# Patient Record
Sex: Male | Born: 1965 | Race: White | Hispanic: No | Smoking: Never smoker
Health system: Southern US, Community
[De-identification: ages and names within clinical notes are randomized; demographics above are authoritative.]

## PROBLEM LIST (undated history)

## (undated) DIAGNOSIS — K219 Gastro-esophageal reflux disease without esophagitis: Secondary | ICD-10-CM

## (undated) DIAGNOSIS — Z8719 Personal history of other diseases of the digestive system: Secondary | ICD-10-CM

## (undated) DIAGNOSIS — E785 Hyperlipidemia, unspecified: Secondary | ICD-10-CM

## (undated) DIAGNOSIS — H903 Sensorineural hearing loss, bilateral: Secondary | ICD-10-CM

## (undated) DIAGNOSIS — C61 Malignant neoplasm of prostate: Secondary | ICD-10-CM

## (undated) DIAGNOSIS — I10 Essential (primary) hypertension: Secondary | ICD-10-CM

## (undated) DIAGNOSIS — S92911A Unspecified fracture of right toe(s), initial encounter for closed fracture: Secondary | ICD-10-CM

## (undated) HISTORY — PX: COLONOSCOPY: SHX174

## (undated) HISTORY — DX: Gastro-esophageal reflux disease without esophagitis: K21.9

## (undated) HISTORY — DX: Essential (primary) hypertension: I10

## (undated) HISTORY — DX: Sensorineural hearing loss, bilateral: H90.3

## (undated) HISTORY — DX: Personal history of other diseases of the digestive system: Z87.19

## (undated) HISTORY — PX: ESOPHAGOGASTRODUODENOSCOPY: SHX1529

## (undated) HISTORY — DX: Hyperlipidemia, unspecified: E78.5

## (undated) HISTORY — DX: Malignant neoplasm of prostate: C61

## (undated) HISTORY — DX: Unspecified fracture of right toe(s), initial encounter for closed fracture: S92.911A

---

## 1988-04-30 HISTORY — PX: APPENDECTOMY: SHX54

## 1998-07-02 ENCOUNTER — Emergency Department (HOSPITAL_COMMUNITY): Admission: EM | Admit: 1998-07-02 | Discharge: 1998-07-02 | Payer: Self-pay | Admitting: *Deleted

## 1998-07-02 ENCOUNTER — Encounter: Payer: Self-pay | Admitting: Emergency Medicine

## 1998-07-06 ENCOUNTER — Encounter: Admission: RE | Admit: 1998-07-06 | Discharge: 1998-07-06 | Payer: Self-pay | Admitting: Hematology and Oncology

## 2000-07-17 ENCOUNTER — Ambulatory Visit (HOSPITAL_COMMUNITY): Admission: RE | Admit: 2000-07-17 | Discharge: 2000-07-17 | Payer: Self-pay | Admitting: Gastroenterology

## 2003-12-24 ENCOUNTER — Encounter: Admission: RE | Admit: 2003-12-24 | Discharge: 2004-01-21 | Payer: Self-pay | Admitting: Family Medicine

## 2004-04-03 ENCOUNTER — Ambulatory Visit: Payer: Self-pay | Admitting: Family Medicine

## 2004-05-12 ENCOUNTER — Ambulatory Visit: Payer: Self-pay | Admitting: Family Medicine

## 2004-06-07 ENCOUNTER — Ambulatory Visit: Payer: Self-pay | Admitting: Family Medicine

## 2004-06-14 ENCOUNTER — Ambulatory Visit: Payer: Self-pay | Admitting: Family Medicine

## 2004-07-25 ENCOUNTER — Ambulatory Visit: Payer: Self-pay | Admitting: Internal Medicine

## 2004-08-08 ENCOUNTER — Ambulatory Visit: Payer: Self-pay | Admitting: Family Medicine

## 2004-08-11 ENCOUNTER — Encounter: Admission: RE | Admit: 2004-08-11 | Discharge: 2004-08-11 | Payer: Self-pay | Admitting: Family Medicine

## 2004-12-11 ENCOUNTER — Ambulatory Visit: Payer: Self-pay | Admitting: Family Medicine

## 2005-05-15 ENCOUNTER — Ambulatory Visit: Payer: Self-pay | Admitting: Family Medicine

## 2005-07-24 ENCOUNTER — Ambulatory Visit: Payer: Self-pay | Admitting: Internal Medicine

## 2005-08-24 ENCOUNTER — Ambulatory Visit: Payer: Self-pay | Admitting: Family Medicine

## 2005-10-26 ENCOUNTER — Ambulatory Visit: Payer: Self-pay | Admitting: Internal Medicine

## 2005-11-05 ENCOUNTER — Ambulatory Visit: Payer: Self-pay | Admitting: Family Medicine

## 2006-01-15 ENCOUNTER — Ambulatory Visit: Payer: Self-pay | Admitting: Family Medicine

## 2006-04-10 ENCOUNTER — Ambulatory Visit: Payer: Self-pay | Admitting: Family Medicine

## 2006-07-01 ENCOUNTER — Ambulatory Visit: Payer: Self-pay | Admitting: Internal Medicine

## 2006-08-16 ENCOUNTER — Ambulatory Visit: Payer: Self-pay | Admitting: Family Medicine

## 2006-09-04 ENCOUNTER — Ambulatory Visit: Payer: Self-pay | Admitting: Family Medicine

## 2006-09-04 LAB — CONVERTED CEMR LAB
ALT: 31 units/L (ref 0–40)
AST: 26 units/L (ref 0–37)
Albumin: 4.2 g/dL (ref 3.5–5.2)
Alkaline Phosphatase: 52 units/L (ref 39–117)
BUN: 11 mg/dL (ref 6–23)
Basophils Absolute: 0 10*3/uL (ref 0.0–0.1)
Basophils Relative: 0.6 % (ref 0.0–1.0)
Bilirubin, Direct: 0.1 mg/dL (ref 0.0–0.3)
CO2: 32 meq/L (ref 19–32)
Calcium: 9.4 mg/dL (ref 8.4–10.5)
Chloride: 112 meq/L (ref 96–112)
Cholesterol: 215 mg/dL (ref 0–200)
Creatinine, Ser: 0.9 mg/dL (ref 0.4–1.5)
Direct LDL: 151.8 mg/dL
Eosinophils Absolute: 0.5 10*3/uL (ref 0.0–0.6)
Eosinophils Relative: 6.7 % — ABNORMAL HIGH (ref 0.0–5.0)
GFR calc Af Amer: 119 mL/min
GFR calc non Af Amer: 98 mL/min
Glucose, Bld: 93 mg/dL (ref 70–99)
HCT: 45.6 % (ref 39.0–52.0)
HDL: 42.9 mg/dL (ref >39.0)
Hemoglobin: 15.8 g/dL (ref 13.0–17.0)
Lymphocytes Relative: 30.1 % (ref 12.0–46.0)
MCHC: 34.6 g/dL (ref 30.0–36.0)
MCV: 90 fL (ref 78.0–100.0)
Monocytes Absolute: 0.7 10*3/uL (ref 0.2–0.7)
Monocytes Relative: 10.4 % (ref 3.0–11.0)
Neutro Abs: 3.6 10*3/uL (ref 1.4–7.7)
Neutrophils Relative %: 52.2 % (ref 43.0–77.0)
Platelets: 283 10*3/uL (ref 150–400)
Potassium: 4.6 meq/L (ref 3.5–5.1)
RBC: 5.07 M/uL (ref 4.22–5.81)
RDW: 12.1 % (ref 11.5–14.6)
Sodium: 147 meq/L — ABNORMAL HIGH (ref 135–145)
TSH: 1.74 microintl units/mL (ref 0.35–5.50)
Total Bilirubin: 1.1 mg/dL (ref 0.3–1.2)
Total CHOL/HDL Ratio: 5
Total Protein: 6.9 g/dL (ref 6.0–8.3)
Triglycerides: 73 mg/dL (ref 0–149)
VLDL: 15 mg/dL (ref 0–40)
WBC: 6.9 10*3/uL (ref 4.5–10.5)

## 2006-09-11 ENCOUNTER — Ambulatory Visit: Payer: Self-pay | Admitting: Family Medicine

## 2006-10-23 ENCOUNTER — Ambulatory Visit: Payer: Self-pay | Admitting: Internal Medicine

## 2007-02-21 ENCOUNTER — Ambulatory Visit: Payer: Self-pay | Admitting: Gastroenterology

## 2007-02-27 ENCOUNTER — Ambulatory Visit: Payer: Self-pay | Admitting: Family Medicine

## 2007-02-27 LAB — CONVERTED CEMR LAB
Glucose, Urine, Semiquant: NEGATIVE
Ketones, urine, test strip: NEGATIVE
Nitrite: NEGATIVE
Protein, U semiquant: NEGATIVE
Specific Gravity, Urine: 1.015
Urobilinogen, UA: NEGATIVE
WBC Urine, dipstick: NEGATIVE
pH: 7.5

## 2007-05-01 HISTORY — PX: WRIST SURGERY: SHX841

## 2007-06-17 ENCOUNTER — Telehealth: Payer: Self-pay | Admitting: Family Medicine

## 2007-06-17 ENCOUNTER — Ambulatory Visit: Payer: Self-pay | Admitting: Family Medicine

## 2007-07-03 ENCOUNTER — Ambulatory Visit (HOSPITAL_COMMUNITY): Admission: RE | Admit: 2007-07-03 | Discharge: 2007-07-05 | Payer: Self-pay | Admitting: Orthopedic Surgery

## 2007-07-22 ENCOUNTER — Ambulatory Visit: Payer: Self-pay | Admitting: Internal Medicine

## 2007-09-03 ENCOUNTER — Ambulatory Visit: Payer: Self-pay | Admitting: Family Medicine

## 2007-09-03 LAB — CONVERTED CEMR LAB
ALT: 33 units/L (ref 0–53)
AST: 25 units/L (ref 0–37)
Albumin: 4.2 g/dL (ref 3.5–5.2)
Alkaline Phosphatase: 57 units/L (ref 39–117)
BUN: 12 mg/dL (ref 6–23)
Basophils Absolute: 0 10*3/uL (ref 0.0–0.1)
Basophils Relative: 0.4 % (ref 0.0–1.0)
Bilirubin, Direct: 0.1 mg/dL (ref 0.0–0.3)
Blood in Urine, dipstick: NEGATIVE
CO2: 29 meq/L (ref 19–32)
Calcium: 9.4 mg/dL (ref 8.4–10.5)
Chloride: 111 meq/L (ref 96–112)
Cholesterol: 223 mg/dL (ref 0–200)
Creatinine, Ser: 0.9 mg/dL (ref 0.4–1.5)
Direct LDL: 174.3 mg/dL
Eosinophils Absolute: 0.1 10*3/uL (ref 0.0–0.7)
Eosinophils Relative: 2.3 % (ref 0.0–5.0)
GFR calc Af Amer: 118 mL/min
GFR calc non Af Amer: 98 mL/min
Glucose, Bld: 103 mg/dL — ABNORMAL HIGH (ref 70–99)
Glucose, Urine, Semiquant: NEGATIVE
HCT: 43.3 % (ref 39.0–52.0)
HDL: 38.6 mg/dL — ABNORMAL LOW (ref >39.0)
Hemoglobin: 15.2 g/dL (ref 13.0–17.0)
Lymphocytes Relative: 27.4 % (ref 12.0–46.0)
MCHC: 35.1 g/dL (ref 30.0–36.0)
MCV: 89.7 fL (ref 78.0–100.0)
Monocytes Absolute: 0.7 10*3/uL (ref 0.1–1.0)
Monocytes Relative: 10.5 % (ref 3.0–12.0)
Neutro Abs: 3.7 10*3/uL (ref 1.4–7.7)
Neutrophils Relative %: 59.4 % (ref 43.0–77.0)
Nitrite: NEGATIVE
Platelets: 250 10*3/uL (ref 150–400)
Potassium: 4.5 meq/L (ref 3.5–5.1)
RBC: 4.82 M/uL (ref 4.22–5.81)
RDW: 12.4 % (ref 11.5–14.6)
Sodium: 144 meq/L (ref 135–145)
Specific Gravity, Urine: 1.02
TSH: 1.81 microintl units/mL (ref 0.35–5.50)
Total Bilirubin: 1.2 mg/dL (ref 0.3–1.2)
Total CHOL/HDL Ratio: 5.8
Total Protein: 6.9 g/dL (ref 6.0–8.3)
Triglycerides: 44 mg/dL (ref 0–149)
Urobilinogen, UA: 0.2
VLDL: 9 mg/dL (ref 0–40)
WBC Urine, dipstick: NEGATIVE
WBC: 6.2 10*3/uL (ref 4.5–10.5)
pH: 5.5

## 2007-09-08 ENCOUNTER — Ambulatory Visit: Payer: Self-pay | Admitting: Family Medicine

## 2007-09-08 ENCOUNTER — Telehealth: Payer: Self-pay | Admitting: Family Medicine

## 2007-09-08 DIAGNOSIS — E785 Hyperlipidemia, unspecified: Secondary | ICD-10-CM | POA: Insufficient documentation

## 2007-09-15 ENCOUNTER — Ambulatory Visit: Payer: Self-pay | Admitting: Cardiology

## 2007-10-04 ENCOUNTER — Encounter: Admission: RE | Admit: 2007-10-04 | Discharge: 2007-10-04 | Payer: Self-pay | Admitting: Orthopaedic Surgery

## 2007-10-23 ENCOUNTER — Ambulatory Visit: Payer: Self-pay | Admitting: Cardiology

## 2007-10-23 LAB — CONVERTED CEMR LAB
Bilirubin, Direct: 0.2 mg/dL (ref 0.0–0.3)
LDL Cholesterol: 107 mg/dL — ABNORMAL HIGH (ref 0–99)
Total Bilirubin: 1.2 mg/dL (ref 0.3–1.2)
VLDL: 15 mg/dL (ref 0–40)

## 2007-10-27 ENCOUNTER — Ambulatory Visit: Payer: Self-pay | Admitting: Cardiology

## 2007-11-24 ENCOUNTER — Ambulatory Visit: Payer: Self-pay | Admitting: Internal Medicine

## 2007-12-02 ENCOUNTER — Ambulatory Visit: Payer: Self-pay | Admitting: Family Medicine

## 2008-01-12 ENCOUNTER — Ambulatory Visit: Payer: Self-pay | Admitting: Family Medicine

## 2008-01-12 LAB — CONVERTED CEMR LAB
Albumin: 4.5 g/dL (ref 3.5–5.2)
HDL: 41.6 mg/dL (ref >39.0)
LDL Cholesterol: 133 mg/dL — ABNORMAL HIGH (ref 0–99)
Total CHOL/HDL Ratio: 4.5
Triglycerides: 71 mg/dL (ref 0–149)
VLDL: 14 mg/dL (ref 0–40)

## 2008-01-19 ENCOUNTER — Ambulatory Visit: Payer: Self-pay | Admitting: Cardiovascular Disease

## 2008-03-12 ENCOUNTER — Ambulatory Visit: Payer: Self-pay | Admitting: Family Medicine

## 2008-03-12 LAB — CONVERTED CEMR LAB
ALT: 37 units/L (ref 0–53)
Bilirubin, Direct: 0.1 mg/dL (ref 0.0–0.3)
Cholesterol: 141 mg/dL (ref 0–200)
HDL: 35.4 mg/dL — ABNORMAL LOW (ref >39.0)
Total Protein: 6.8 g/dL (ref 6.0–8.3)
VLDL: 7 mg/dL (ref 0–40)

## 2008-07-19 ENCOUNTER — Ambulatory Visit: Payer: Self-pay | Admitting: Internal Medicine

## 2008-07-19 DIAGNOSIS — J309 Allergic rhinitis, unspecified: Secondary | ICD-10-CM | POA: Insufficient documentation

## 2008-09-22 ENCOUNTER — Ambulatory Visit: Payer: Self-pay | Admitting: Family Medicine

## 2008-09-22 LAB — CONVERTED CEMR LAB
ALT: 40 units/L (ref 0–53)
AST: 37 units/L (ref 0–37)
Alkaline Phosphatase: 55 units/L (ref 39–117)
Bilirubin Urine: NEGATIVE
Bilirubin, Direct: 0.2 mg/dL (ref 0.0–0.3)
CO2: 28 meq/L (ref 19–32)
Calcium: 8.9 mg/dL (ref 8.4–10.5)
Creatinine, Ser: 0.9 mg/dL (ref 0.4–1.5)
Direct LDL: 177.4 mg/dL
Eosinophils Absolute: 0.1 10*3/uL (ref 0.0–0.7)
Glucose, Bld: 104 mg/dL — ABNORMAL HIGH (ref 70–99)
Glucose, Urine, Semiquant: NEGATIVE
MCHC: 35.2 g/dL (ref 30.0–36.0)
MCV: 91.3 fL (ref 78.0–100.0)
Monocytes Absolute: 0.7 10*3/uL (ref 0.1–1.0)
Neutrophils Relative %: 52.8 % (ref 43.0–77.0)
Platelets: 265 10*3/uL (ref 150.0–400.0)
RDW: 12.1 % (ref 11.5–14.6)
TSH: 1.56 microintl units/mL (ref 0.35–5.50)
Total Bilirubin: 1.5 mg/dL — ABNORMAL HIGH (ref 0.3–1.2)
Triglycerides: 74 mg/dL (ref 0.0–149.0)
Urobilinogen, UA: 0.2

## 2008-10-07 ENCOUNTER — Ambulatory Visit: Payer: Self-pay | Admitting: Family Medicine

## 2008-11-02 ENCOUNTER — Ambulatory Visit: Payer: Self-pay | Admitting: Family Medicine

## 2009-10-03 ENCOUNTER — Ambulatory Visit: Payer: Self-pay | Admitting: Family Medicine

## 2009-10-03 LAB — CONVERTED CEMR LAB
ALT: 31 units/L (ref 0–53)
AST: 26 units/L (ref 0–37)
Albumin: 4.5 g/dL (ref 3.5–5.2)
Alkaline Phosphatase: 57 units/L (ref 39–117)
Basophils Absolute: 0 10*3/uL (ref 0.0–0.1)
Basophils Relative: 0.6 % (ref 0.0–3.0)
CO2: 29 meq/L (ref 19–32)
GFR calc non Af Amer: 101.77 mL/min (ref >60)
Glucose, Bld: 102 mg/dL — ABNORMAL HIGH (ref 70–99)
Glucose, Urine, Semiquant: NEGATIVE
HCT: 45.6 % (ref 39.0–52.0)
Hemoglobin: 15.9 g/dL (ref 13.0–17.0)
Lymphocytes Relative: 34 % (ref 12.0–46.0)
Lymphs Abs: 2.2 10*3/uL (ref 0.7–4.0)
Monocytes Relative: 10.4 % (ref 3.0–12.0)
Neutro Abs: 3.6 10*3/uL (ref 1.4–7.7)
Nitrite: NEGATIVE
Potassium: 5.4 meq/L — ABNORMAL HIGH (ref 3.5–5.1)
RBC: 4.97 M/uL (ref 4.22–5.81)
RDW: 12.8 % (ref 11.5–14.6)
Sodium: 146 meq/L — ABNORMAL HIGH (ref 135–145)
Specific Gravity, Urine: 1.025
TSH: 1.33 microintl units/mL (ref 0.35–5.50)
Total CHOL/HDL Ratio: 3
Total Protein: 6.9 g/dL (ref 6.0–8.3)
VLDL: 8.8 mg/dL (ref 0.0–40.0)
WBC Urine, dipstick: NEGATIVE
pH: 5.5

## 2009-10-10 ENCOUNTER — Telehealth: Payer: Self-pay | Admitting: Family Medicine

## 2009-11-07 ENCOUNTER — Ambulatory Visit: Payer: Self-pay | Admitting: Family Medicine

## 2010-05-30 NOTE — Assessment & Plan Note (Signed)
Summary: cpx//ccm/pt rescd//ccm   Vital Signs:  Patient profile:   45 year old male Height:      69 inches Weight:      208 pounds BMI:     30.83 Temp:     98.1 degrees F oral BP sitting:   140 / 82  (left arm) Cuff size:   regular  Vitals Entered By: Kathrynn Speed CMA (November 07, 2009 11:28 AM) CC: CPX/ w labs   Primary Care Provider:  Alonza Smoker  CC:  CPX/ w labs.  History of Present Illness: [name] is a 45 your old male, who comes in today to review his lab work from his physical examination  He had to go to [place] and family business and did get the report on his lab work.   he called here to get the report, but was told he could get it over the phone!!!!!!!!!!!!!.  His lab work was, reviewed.  It's all normal.  Return one year  Current Medications (verified): 1)  Crestor 10 Mg Tabs (Rosuvastatin Calcium) .Marland Kitchen.. 1 Tab @ Bedtime  Allergies (verified): No Known Drug Allergies   Complete Medication List: 1)  Crestor 10 Mg Tabs (Rosuvastatin calcium) .Marland Kitchen.. 1 tab @ bedtime  Other Orders: No Charge Patient Arrived (NCPA0) (NCPA0)

## 2010-05-30 NOTE — Progress Notes (Signed)
Summary: lab results  Phone Note Call from Patient Call back at Home Phone [phone]   Caller: Patient Call For: Roderick Pee MD Summary of Call: pt cancelled cpx today due death in family. Pt will like cpx labs results Initial call taken by: Heron Sabins,  October 10, 2009 8:53 AM  Follow-up for Phone Call        will review his lab work, when he comes in for his physical Follow-up by: Roderick Pee MD,  October 10, 2009 11:05 AM  Additional Follow-up for Phone Call Additional follow up Details #1::        left message on machine for patient  Additional Follow-up by: Kern Reap CMA Duncan Dull),  October 10, 2009 5:26 PM

## 2010-06-27 ENCOUNTER — Encounter: Payer: Self-pay | Admitting: Family Medicine

## 2010-06-27 ENCOUNTER — Ambulatory Visit (INDEPENDENT_AMBULATORY_CARE_PROVIDER_SITE_OTHER): Payer: BC Managed Care – PPO | Admitting: Family Medicine

## 2010-06-27 VITALS — BP 120/70 | Temp 98.5°F | Ht 71.0 in | Wt 205.0 lb

## 2010-06-27 DIAGNOSIS — R4702 Dysphasia: Secondary | ICD-10-CM

## 2010-06-27 DIAGNOSIS — R4789 Other speech disturbances: Secondary | ICD-10-CM

## 2010-06-27 NOTE — Patient Instructions (Signed)
Dietary wise, remember to choose soft food.........Marland Kitchen Mistake hotdogs, etc. This difficult to swallow.  Drink lots of water with her meals.  Taken OTC Prilosec 20 mg twice daily.  I have put in a request for a GI consult ASAP

## 2010-06-27 NOTE — Progress Notes (Signed)
  Subjective:    Patient ID: [name], male    DOB: [birth year], 45 y.o.   MRN: [id number]  HPI [name] is a delightful, 45 year old male, married, nonsmoker, who comes in today with day 8 week history of dysphasia.  He states about 8 weeks ago he started have difficulty swallowing.  He points to the right side of his neck as a source of his discomfort.  He thinks is getting worse.  In the past.  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  He denies any recent history of reflux, chest pain, shortness of breath, et Karie Soda.  No weight loss.  Review of systems otherwise negative   Review of Systems    Negative Objective:   Physical Exam Well-developed well-nourished, male in no acute distress.  HEENT negative.  Neck was supple.  No adenopathy.  Thyroid not enlarged.  No palpable masses       Assessment & Plan:  Dysphasia,,,,,,,,,,,, plan GI consult

## 2010-06-28 ENCOUNTER — Encounter (INDEPENDENT_AMBULATORY_CARE_PROVIDER_SITE_OTHER): Payer: Self-pay | Admitting: *Deleted

## 2010-07-06 NOTE — Letter (Signed)
Summary: New Patient letter  [facility]  [address] [place], [place] [address]   Phone: [phone]  Fax: [phone]       06/28/2010 MRN: [id number]  [name] [address] [place], [place]  [address]  [place]  Dear [name],  Welcome to the Gastroenterology Division at [facility].    You are scheduled to see Dr.  Arlyce Dice on 08-09-10 at 10:00A.M. on the 3rd floor at [facility], [address]  We ask that you try to arrive at our office 15 minutes prior to your appointment time to allow for check-in.  We would like you to complete the enclosed self-administered evaluation form prior to your visit and bring it with you on the day of your appointment.  We will review it with you.  Also, please bring a complete list of all your medications or, if you prefer, bring the medication bottles and we will list them.  Please bring your insurance card so that we may make a copy of it.  If your insurance requires a referral to see a specialist, please bring your referral form from your primary care physician.  Co-payments are due at the time of your visit and may be paid by cash, check or credit card.     Your office visit will consist of a consult with your physician (includes a physical exam), any laboratory testing he/she may order, scheduling of any necessary diagnostic testing (e.g. x-ray, ultrasound, CT-scan), and scheduling of a procedure (e.g. Endoscopy, Colonoscopy) if required.  Please allow enough time on your schedule to allow for any/all of these possibilities.    If you cannot keep your appointment, please call [phone] to cancel or reschedule prior to your appointment date.  This allows [place] the opportunity to schedule an appointment for another patient in need of care.  If you do not cancel or reschedule by 5 p.m. the business day prior to your appointment date, you will be charged a $50.00 late cancellation/no-show fee.    Thank you for  choosing [facility] for your medical needs.  We appreciate the opportunity to care for you.  Please visit [place] at our website  to learn more about our practice.                     Sincerely,                                                             The Gastroenterology Division

## 2010-07-12 ENCOUNTER — Telehealth (INDEPENDENT_AMBULATORY_CARE_PROVIDER_SITE_OTHER): Payer: Self-pay | Admitting: *Deleted

## 2010-07-18 NOTE — Progress Notes (Signed)
Summary: [facility] returned call  Phone Note Call from Patient Call back at Home Phone [phone]   Caller: Patient Call For: YOUNG Summary of Call: Patient phoned stated that he has an appt on 08/08/10 but his allergies are starting to kick in and he wants to know if Dr Maple Hudson will [facility] [address]. Pt can be reached at [phone] Initial call taken by: Vedia Coffer,  July 12, 2010 11:44 AM  Follow-up for Phone Call        [facility]Michel Bickers CMA  July 12, 2010 12:13 PM  Returning call.Darletta Moll  July 12, 2010 12:17 PM 07/19/2008 Pt last seen by [facility]Y 07/19/2008 but does have appt sch for 08/08/2010. Pt c/o itchy, watery eyes and has tried OTC Allegra but had no relief and is requesting something called to his pharmacy. Pt request that we call him before sending in RX, in case, pharmacy location needs to be changed since he is leaving for [facility] in a few minutes.Pls advise.    NKDA Follow-up by: Michel Bickers CMA,  July 12, 2010 12:23 PM  Additional Follow-up for Phone Call Additional follow up Details #1::        Per CD[facility]-okay to try combination of allegra 180 or zyrtec 10(generic okay) plus OTC allergy eye drops like Visine AC or Naphon A plus Sunglasses.Reynaldo Minium CMA  July 12, 2010 12:30 PM   LMTCB.Reynaldo Minium CMA  July 12, 2010 12:34 PM  pt returned call. Tivis Ringer, CNA  July 12, 2010 5:20 PM    Additional Follow-up for Phone Call Additional follow up Details #2::    LMTCB.Reynaldo Minium CMA  July 12, 2010 5:25 PM    Pt called me back and understands to try the recs from [facility]; will call me back if this doesnt work. If it doesnt work pt would like to have patanol eye drops and xyzal Rx called to a pharmacy where he is out of town.Reynaldo Minium CMA  July 12, 2010 5:31 PM

## 2010-08-01 ENCOUNTER — Encounter: Payer: Self-pay | Admitting: Family Medicine

## 2010-08-01 ENCOUNTER — Ambulatory Visit (INDEPENDENT_AMBULATORY_CARE_PROVIDER_SITE_OTHER): Payer: BC Managed Care – PPO | Admitting: Family Medicine

## 2010-08-01 DIAGNOSIS — M722 Plantar fascial fibromatosis: Secondary | ICD-10-CM | POA: Insufficient documentation

## 2010-08-01 NOTE — Patient Instructions (Signed)
Take 800 mg of Motrin twice daily with food.  Stretching exercises twice daily.  15 minutes of ice at bedtime.  If in two to 3 weeks.  Her pain does not go away, and call, and we will get to set up to see [name], physical therapist

## 2010-08-01 NOTE — Progress Notes (Signed)
  Subjective:    Patient ID: [name], male    DOB: 02-05-1966, 45 y.o.   MRN: [id number]  HPI [name] is a 45 your male, who comes in today for evaluation of pain in his right foot for about two weeks.  No history of trauma   Review of Systems General and orthopedic review of systems otherwise negative    Objective:   Physical Exam    Well-developed well-nourished, male in no acute distress.  Examination of right foot shows the foot appears to be normal.  Skin stomal pulses normal.  There is palpable tenderness in the heel muscle, consistent with plantar fasciitis    Assessment & Plan:  Plantar fasciitis.  Plan Motrin, stretching, ice boot, [name], physical therapist if symptoms persist

## 2010-08-04 ENCOUNTER — Ambulatory Visit: Payer: BC Managed Care – PPO | Admitting: Internal Medicine

## 2010-08-08 ENCOUNTER — Ambulatory Visit: Payer: BC Managed Care – PPO | Admitting: Internal Medicine

## 2010-08-09 ENCOUNTER — Encounter: Payer: Self-pay | Admitting: Gastroenterology

## 2010-08-09 ENCOUNTER — Ambulatory Visit (INDEPENDENT_AMBULATORY_CARE_PROVIDER_SITE_OTHER): Payer: BC Managed Care – PPO | Admitting: Gastroenterology

## 2010-08-09 VITALS — BP 122/80 | HR 60 | Ht 71.0 in | Wt 197.8 lb

## 2010-08-09 DIAGNOSIS — R1310 Dysphagia, unspecified: Secondary | ICD-10-CM | POA: Insufficient documentation

## 2010-08-09 NOTE — Assessment & Plan Note (Addendum)
There are no obvious abnormalities on exam to explain his symptoms. A mucosal abnormality of the esophagus from esophagitis or perhaps candida should be ruled out.  Recommendations #1 upper endoscopy. #2 to consider CT of the neck. If endoscopy is unrevealing

## 2010-08-09 NOTE — Progress Notes (Signed)
History of Present Illness:  Mr. [name] is a pleasant, 45 year old white male referred at the request of Dr. Tawanna Cooler for evaluation of odynophagia. For the past 2, months he's been complaining of a sore throat. That has subsided. Following his sore throat he developed pain in his right neck upon swallowing. He denies dysphagia, per se. He has been on no antibiotics.    Review of Systems: Pertinent positive and negative review of systems were noted in the above HPI section. All other review of systems were otherwise negative.    Current Medications, Allergies, Past Medical History, Past Surgical History, Family History and Social History were reviewed in [employer] electronic medical record  Vital signs were reviewed in today's medical record. Physical Exam: General: Well developed , well nourished, no acute distress Head: Normocephalic and atraumatic Eyes:  sclerae anicteric, EOMI Ears: Normal auditory acuity Mouth: No deformity or lesions Lungs: Clear throughout to auscultation Heart: Regular rate and rhythm; no murmurs, rubs or bruits Abdomen: Soft, non tender and non distended. No masses, hepatosplenomegaly or hernias noted. Normal Bowel sounds Rectal:deferred Musculoskeletal: Symmetrical with no gross deformities  Pulses:  Normal pulses noted Extremities: No clubbing, cyanosis, edema or deformities noted Neurological: Alert oriented x 4, grossly nonfocal Psychological:  Alert and cooperative. Normal mood and affect

## 2010-08-09 NOTE — Patient Instructions (Addendum)
CcTinnie Gens Todd,MD Your EGD is scheduled on 08/31/2010 at 3pm Dysphagia (Swallowing Problems) Swallowing problems occur when solids and liquids seem to stick in your throat on the way down to your stomach, or the food takes longer to get to the stomach. Other symptoms (problems) include regurgitating (burping) up food, noises coming from the throat, chest discomfort with swallowing, and a feeling of fullness in the throat when swallowing. When blockage in the throat is complete it may be associated with drooling. CAUSES OF DYSPHAGIA (SWALLOWING PROBLEMS) There are many causes of swallowing difficulties and the following is generalized information regarding a number of reasons for this problem. Problems with swallowing may occur because of problems with the muscles. The food cannot be propelled in the usual manner into the stomach. There may be ulcers, scar tissue or inflammation (soreness) in the esophagus (the food tube from the mouth to the stomach) which blocks food from passing normally into the stomach. Causes of inflammation include acid reflux from the stomach into the esophagus. Inflammation can also be caused by the herpes simplex virus, Candida (yeast), radiation (as with treatment of cancer), or inflammation from medications not taken with adequate fluids to wash them down into the stomach. There may be nerve problems so signals cannot be sent adequately telling the muscles of the esophagus to contract and move the food along. Achalasia is a rare disorder of the esophagus in which muscular contractions of the esophagus are uncoordinated. Globus hystericus is a relatively common problem in young females in which there is a sense of an obstruction or difficulty in swallowing, but in which no abnormalities can be found. This problem usually improves over time with reassurance and testing to rule out other causes. EVALUATION (DIAGNOSIS) OF SWALLOWING PROBLEMS A number of tests will help your caregiver  know what is the cause of your swallowing problems. These tests may include a barium swallow in which x-rays are taken while you are drinking a liquid that outlines the lining of the esophagus on x-ray. If the stomach and small bowel are also studied in this manner it is called an upper gastrointestinal exam (UGI). Endoscopy may be done in which your caregiver examines your throat, esophagus, stomach and small bowel with an instrument like a small flexible telescope. Motility studies which measure the effectiveness and coordination of the muscular contractions of the esophagus may also be done. TREATMENT AND ITS IMPORTANCE The treatment of swallowing problems are many, varying from medications to surgical treatment. The treatment varies with the type of problem found. Your caregiver will discuss your results and treatment with you. If swallowing problems are severe the long term problems which may occur include: malnutrition, pneumonia (from food going into the breathing tubes called trachea and bronchi), and an increase in tumors (lumps) of the esophagus. SEEK IMMEDIATE MEDICAL ATTENTION IF:  Food or other object becomes lodged in your throat or esophagus and won't move.  Document Released: 04/13/2000 Document Re-Released: 07/11/2009 [facility] Patient Information 2011 [place], [place].

## 2010-08-30 ENCOUNTER — Encounter: Payer: Self-pay | Admitting: Gastroenterology

## 2010-08-31 ENCOUNTER — Encounter: Payer: Self-pay | Admitting: Gastroenterology

## 2010-08-31 ENCOUNTER — Ambulatory Visit (AMBULATORY_SURGERY_CENTER): Payer: BC Managed Care – PPO | Admitting: Gastroenterology

## 2010-08-31 DIAGNOSIS — K208 Other esophagitis without bleeding: Secondary | ICD-10-CM

## 2010-08-31 DIAGNOSIS — K2980 Duodenitis without bleeding: Secondary | ICD-10-CM

## 2010-08-31 DIAGNOSIS — K2970 Gastritis, unspecified, without bleeding: Secondary | ICD-10-CM

## 2010-08-31 DIAGNOSIS — R1310 Dysphagia, unspecified: Secondary | ICD-10-CM

## 2010-08-31 DIAGNOSIS — K2960 Other gastritis without bleeding: Secondary | ICD-10-CM

## 2010-08-31 DIAGNOSIS — K2990 Gastroduodenitis, unspecified, without bleeding: Secondary | ICD-10-CM

## 2010-08-31 MED ORDER — OMEPRAZOLE 20 MG PO CPDR: 20.0000 mg | DELAYED_RELEASE_CAPSULE | Freq: Every day | ORAL | Status: DC

## 2010-08-31 MED ORDER — SODIUM CHLORIDE 0.9 % IV SOLN: 500.0000 mL | INTRAVENOUS | Status: DC

## 2010-08-31 NOTE — Patient Instructions (Addendum)
Possible nodule in hypopharynx.  Ear, nose and throat physician referral.  The nurse on the third floor will call you with this appointment.  If you have not heard from them by Monday, May 7th please call Dr. Marzetta Board office.  Dr. Arlyce Dice saw erosive esophagitis and gastroduodenitis.  A prescription was called to your pharmacy for Prilosec 20 mg take 1 pill every am 20 - 30 minutes before breakfast.  Continue prior medications today.  Please call with any questions or concerns.  Informational handouts were given to your care partner on gastritis and esophagitis.

## 2010-09-01 ENCOUNTER — Telehealth: Payer: Self-pay | Admitting: *Deleted

## 2010-09-01 NOTE — Telephone Encounter (Signed)

## 2010-09-05 ENCOUNTER — Telehealth: Payer: Self-pay | Admitting: Gastroenterology

## 2010-09-05 NOTE — Telephone Encounter (Signed)
Pt is scheduled to see Dr. Flo Shanks 09/26/10@2 :40pm. Pt to arrive prior to the appt time. Pt to bring current list of meds with him and any copay. Have left pt a message to call me regarding appt date and time.

## 2010-09-05 NOTE — Telephone Encounter (Signed)
Pt aware of appt date and time

## 2010-09-12 NOTE — Assessment & Plan Note (Signed)
[facility]                               LIPID CLINIC NOTE   ROSWELL, NDIAYE                     MRN:          [id number]  DATE:09/15/2007                            DOB:          [birth year]    [name] is a pleasant 45 year old gentleman who is seen in the lipid  clinic for further evaluation and medication titration associated with  history of hyperlipidemia and cholesterol medication intolerances.  [name] has been feeling and doing well overall.  He has no current  muscle aches, pain, weakness or problems.  Upon review of his previous  cholesterol medication intolerances, he states that Zocor, Lipitor and  Zetia caused a little bit of dizziness and just made him not feel right.  He states that he recognizes some of this may be him and some of this  may be the medication, he is not sure.   PAST MEDICAL HISTORY:  Pertinent for seasonal allergies and a recent  broken arm which prevented him from getting back into the gym.  He has  been completing his healing activities and is moving back into the gym.  He is running his norm, which he is getting back into in the past week  is to run 2-1/2 miles each day 3 to 5 times a week.  He is a never  smoker who does not consume alcohol regularly.  His diet review includes  lean meats, increased amounts of vegetables.  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   REVIEW OF SYSTEMS:  As stated in the HPI and otherwise negative.   CURRENT MEDICATIONS:  Current medications include aspirin 325 mg daily  and p.r.n. allergy medications.   LABS:  On Sep 03, 2007 reveal total cholesterol of 223, triglycerides 44,  HDL 38.6, LDL 174, LFTs are normal.   PHYSICAL EXAMINATION:  On physical exam, weight today is 192 pounds.  Heart rate is 60, respirations are 16.   ASSESSMENT:  The patient has  hyperlipidemia, probably associated with  genetics versus lifestyle, modification activities are quite good.   PLAN:  I spent greater than 35 minutes discussing with the patient the  opportunities that we have to reduce his LDL.  We have talked about the  mechanisms through which statins work, the data behind them, including  heart protection study and [place], and the various agents, their  lipophilicity and hydrophilicity, and the potential to use these agents  in appropriate fashion to reduce [name] overall risk.  He is open  and receptive to medication therapy at this time as he wants to reduce  his overall risk.  Therefore, I have asked him to begin Crestor 10 mg  daily.  I have given him samples to facilitate compliance, and he will  begin on 10 mg a day.  I have told him he can take it at any point in  time during the day with or without food.  If he begins to  have that  dizzy or not feeling right symptomatology, he understands and agrees to  call me and let me know, and then we will work to try every other day or  every third day therapy, along those lines.  He understands that this is  something that we will work together on over the next several months and  get to appropriate values and then return for long-term followup with  Dr. Tawanna Cooler.  I appreciate the opportunity to see this pleasant patient.  Thank you for the opportunity to meet him.      Shelby Dubin, PharmD, BCPS, CPP  Electronically Signed      Rollene Rotunda, MD, [facility]  Electronically Signed   MP/MedQ  DD: 09/17/2007  DT: 09/17/2007  Job #: [id number]   cc:   Tinnie Gens A. Tawanna Cooler, MD

## 2010-09-12 NOTE — Assessment & Plan Note (Signed)
[facility]                               LIPID CLINIC NOTE   [name], [name]                     MRN:          [id number]  DATE:01/19/2008                            DOB:          [birth year]    The patient was seen in the Lipid Clinic for further evaluation and  medication titration.  [name] is a pleasant gentleman seen back in  the Lipid Clinic with high-risk features.  He has familial elevation of  lipids.  He has been compliant with his Crestor therapy excepting during  a 3-week period as he traveled to his brother-in-law's home due to his  death and spent about 3 weeks out in the [place] area.  The patient has  had no muscle aches, pain, weakness, fatigue, or other problems  associated with this therapy just Crestor 10 mg daily.   CURRENT MEDICATIONS:  1. Crestor 10 mg daily.  2. Fish oil daily.  3. Multivitamin daily.  4. Aspirin 81 mg daily.  5. Motrin as needed for muscle aches and pains.  The patient has      started to get back into his daily running activity.   REVIEW OF SYSTEMS:  As stated in the HPI, otherwise, negative.   PHYSICAL EXAMINATION:  Heart rate today is 60, respirations are 16.  His  weight is 195 pounds.   LABORATORY DATA:  Total cholesterol 189, triglycerides 71, HDL 41.6, and  LDL 133.  LFTs are within normal limits.   ASSESSMENT:  The patient has risk factors for coronary artery disease,  but no documented disease at this time.  He made good improvements.  He  agrees to continue to work on dietary and exercise therapy.  He will  continue on his Crestor 10 for now.  We will recheck him in 8-12 weeks.  We talked extensively today regarding dietary interventions, and I will  obtain for him a  table of what food combos equal complete proteins and send that to his  home address on [address] in [place].  The patient will call  with questions or problems in the meantime.       [name],  PharmD, BCPS, CPP  Electronically Signed      Rollene Rotunda, MD, [facility]  Electronically Signed   MP/MedQ  DD: 01/23/2008  DT: 01/24/2008  Job #: [id number]   cc:   Tinnie Gens A. Tawanna Cooler, MD

## 2010-09-12 NOTE — Op Note (Signed)
[name], [name]              ACCOUNT NO.:  [id number]   MEDICAL RECORD NO.:  [id number]          PATIENT TYPE:  OIB   LOCATION:  5028                         FACILITY:  [facility]   PHYSICIAN:  Cory Ano. Gramig III, M.D.DATE OF BIRTH:  December 27, 1965   DATE OF PROCEDURE:  07/03/2007  DATE OF DISCHARGE:                               OPERATIVE REPORT   PREOPERATIVE DIAGNOSIS:  Comminuted, complex intra-articular greater  than five-part distal radius fracture, left upper extremity, with  significant bone loss.   POSTOPERATIVE DIAGNOSIS:  Comminuted, complex intra-articular greater  than five-part distal radius fracture, left upper extremity, with  significant bone loss.   PROCEDURES:  1. Open reduction and internal fixation of the left distal radius      fracture with DVR [employer] plate and Ortho-Blast bone      graft.  2. Post interosseous nerve neurectomy.  3. Extensor pollicis longus decompression and transposition to the      dorsal soft tissues.  This required debridement as this was a bony      shard encased in the extensor pollicis longus.  4. Extensor carpi radialis brevis and radialis longus tendon sheath      decompression.  5. Stress radiography.   SURGEON:  Cory Ano. Amanda Pea, M.D.   ASSISTANT:  Karie Chimera, P.A.-C.   COMPLICATIONS:  None.   ANESTHESIA:  General with preoperative block.   TOURNIQUET TIME:  Less than an hour.   INDICATIONS FOR PROCEDURE:  This patient is a 45 year old male who  presents with the above-mentioned diagnosis.  I have counseled him  extensively with regard to the risks and benefits of surgery including  the risk of infection, bleeding, anesthesia, damage to normal structures  and failure of surgery to accomplish its intended goals of relieving  symptoms and restoring function.  With this in mind he desires to  proceed.  All questions have been encouraged and answered  preoperatively.   OPERATIVE PROCEDURE:  The patient was  seen by myself and anesthesia and  taken to the operative suite and underwent smooth induction of general  anesthesia.  Preoperatively a permit was signed, the patient and his  wife were counseled, and I did request a preoperative axillary block.  The patient's family has a history of complex regional pain syndrome and  we are doing our best to avoid sympathetic-mediated pain response  postoperatively.  Once the block was given the patient was taken to the  operative suite, underwent a general anesthetic, placed supine,  appropriately padded, prepped and draped in the usual sterile fashion  with Betadine scrub and paint about the left upper extremity.  Following  this the patient then underwent fingertrap traction placement, followed  by a volar radial incision.  Dissection was carried down and the FCR  tendon sheath was incised palmarly and dorsally.  Fasciotomy was  performed.  Following this, pronator was released in a radial to ulnar  direction, exposing the very comminuted complex intra-articular  fracture.  I then placed a DVR plate and screw construct in standard AO  technique, and this was done without difficulty.  Following  this,  pronator was repaired.  I took care to avoid the median nerve and I was  very careful of the flexor pollicis longus tendon during this portion of  the operative procedure.  Following this, I then turned attention  dorsally.  The patient had a significant bony shard embedded in the EPL  tendon and displaced distal to the joint line.  This was noted dorsally,  of course.  Incision was made, dissection carried down.  The EPL tendon  was identified and released without difficulty and then transposed to  the dorsal soft tissues.  A bony shard was embedded in this tendon,  interestingly enough, and it did undergo debridement (that is, tendon).  Following that transposition to the soft tissues, the patient then  underwent a very careful evaluation of the bony  shard, which was  elevated and moved back.  I then immediately encountered a significant  hematoma in the sheath ECRB and ECRL tendons and thus I decompressed  these tendons without complicating feature.  Following this, I then  performed packing of bone graft dorsally in the dorsal V defect.  This  was an outstandingly impressive defect and I placed OrthoBlast bone  filler in the form of an allograft.  The patient tolerated this well and  following this, the bony dorsal shard was replaced in its proper  position.  Once this was done I then irrigated copiously and closed the  dorsal wound with Prolene.  The volar wound was closed with Vicryl  followed by Prolene.  His compartments were soft, refill was excellent,  and there were no complicating features.  Stress radiography revealed  excellent position.  Fluoroscopy revealed excellent position as well.  There were no complicating features.  He was taken to the recovery room  in stable condition.  All sponge, needle and instruments counts were  reported as correct.  The patient will be monitored in the recovery room  note and we will be monitoring his condition closely.  We will plan for  immobilization for 4-6 weeks, gentle range of motion of the wrist will  be performed.  However, I will hold his strengthening for 10-12 weeks  postop given the impressive amount of bone loss.  He is going to have to  incorporate the bone loss and is going to be followed very closely.  Thus, I going to be very cautious in terms of any strengthening until I  am secure with this healing.  Motion will be performed at 6-8 weeks, of  course.  I have discussed with the patient the do's and don'ts, etc.,  and all questions have been encouraged and answered.  It has been a  pleasure to see him today.  All questions have been addressed.           ______________________________  [name], M.D.     [name]  D:  07/03/2007  T:  07/04/2007  Job:   [id number]

## 2010-09-15 NOTE — Assessment & Plan Note (Signed)
[facility]                               LIPID CLINIC NOTE   KOSISOCHUKWU, BURNINGHAM                     MRN:          [id number]  DATE:10/27/2007                            DOB:          1965/12/15    HISTORY:  Mr. Reth is seen back in the Lipid Clinic today for further  evaluation and medication titration associated with his hyperlipidemia.  He has been increasing his physical activities.  His arm continues to  improve.  He continues to work his significant 80-90 hours each week,  though he has increased his physical activity as well.  He has had no  muscle aches, pains, weakness, fatigue, or other problems associated  with his Crestor 10 mg daily.  He states he has been compliant with this  regimen and is doing well with it overall.  He has been adding to his  physical activity regimen and is working back towards running.   PAST MEDICAL HISTORY:  Pertinent for seasonal allergies and  hyperlipidemia.   CURRENT MEDICATIONS:  Crestor 10 mg daily.   REVIEW OF SYSTEMS:  As stated in the HPI and otherwise negative.   PHYSICAL EXAMINATION:  Blood pressure today is 124/76, heart rate is 60,  and respirations are 16.   LABORATORY DATA:  Labs demonstrate significant improvement towards  goals.  We will continue the patient on plan.   ASSESSMENT:  The patient with hyperlipidemia is doing well overall.  I  have answered his questions.   PLAN:  Will be for the patient to continue on Crestor at 10 mg daily.  [facility]  [facility] [address] with [facility] refills.  We will  see him back in 3 or 4 months with laboratory work and an appointment at  that time and in the fall as well.  Return the patient to follow up with  his primary care physician.  As we need further LDL improvement, we have  discussed increasing to 20 mg versus continuing the 10 with increased  activity and dietary attention.  The patient wishes  to follow in this fashion  for now and I have agreed that this is  acceptable.  Time spent with the patient is 30 minutes.      Shelby Dubin, PharmD, BCPS, CPP  Electronically Signed      Rollene Rotunda, MD, [facility]  Electronically Signed   MP/MedQ  DD: 11/05/2007  DT: 11/06/2007  Job #: [id number]   cc:   Titus Dubin. Alwyn Ren, MD,FACP,FCCP

## 2010-09-15 NOTE — Assessment & Plan Note (Signed)
[facility]                             PULMONARY OFFICE NOTE   [name], ARRIAGA                       MRN:          [id number]  DATE:07/01/2006                            DOB:          04-14-1966    PROBLEM LIST:  1. Allergic rhinitis.  2. Allergic conjunctivitis.   HISTORY:  One-year followup.  Symptoms are very seasonal, and he expects  that spring pollens will bother him but so far not yet.  Needs routine  medications refilled.  He has no asthma and is told that he does not  snore.   MEDICATIONS:  P.R.N. use of Claritin and Platinol with no routine  medications, no medication allergy.   OBJECTIVE:  VITAL SIGNS: Weight 197 pounds.  Blood pressure 102/80,  pulse regular at 71.  Room air saturation 98%.  HEENT:  There was turbinate edema and frequent sniffing but no real  inflammation, no post nasal drainage.  Pharynx is clear.  CHEST:  Clear.  CARDIAC: Heart sounds normal.   IMPRESSION:  Spring seasonal allergic rhinitis and conjunctivitis which  are starting soon.   PLAN:  1. Environmental precautions were reviewed.  2. Refilled prescriptions:  Allegra 180 mg daily, Platinol 1-2 drops      each eye b.i.d. p.r.n.  We have discussed available treatment      options.  3. Schedule one-year followup, earlier p.r.n.     [name], MD, [facility], FACP  Electronically Signed    CDY/MedQ  DD: 07/07/2006  DT: 07/07/2006  Job #: [id number]

## 2010-09-27 ENCOUNTER — Encounter (HOSPITAL_BASED_OUTPATIENT_CLINIC_OR_DEPARTMENT_OTHER)
Admission: RE | Admit: 2010-09-27 | Discharge: 2010-09-27 | Disposition: A | Discharge status: Home / Self Care | Payer: BC Managed Care – PPO | Source: Ambulatory Visit | Attending: Otolaryngology | Admitting: Otolaryngology

## 2010-10-02 ENCOUNTER — Other Ambulatory Visit: Payer: Self-pay | Admitting: Otolaryngology

## 2010-10-02 ENCOUNTER — Ambulatory Visit (HOSPITAL_BASED_OUTPATIENT_CLINIC_OR_DEPARTMENT_OTHER)
Admission: RE | Admit: 2010-10-02 | Discharge: 2010-10-02 | Disposition: A | Discharge status: Home / Self Care | Payer: BC Managed Care – PPO | Source: Ambulatory Visit | Attending: Otolaryngology | Admitting: Otolaryngology

## 2010-10-02 DIAGNOSIS — Z01812 Encounter for preprocedural laboratory examination: Secondary | ICD-10-CM | POA: Insufficient documentation

## 2010-10-02 DIAGNOSIS — D107 Benign neoplasm of hypopharynx: Secondary | ICD-10-CM | POA: Insufficient documentation

## 2010-10-02 DIAGNOSIS — Z01810 Encounter for preprocedural cardiovascular examination: Secondary | ICD-10-CM | POA: Insufficient documentation

## 2010-10-02 LAB — POCT HEMOGLOBIN-HEMACUE: Hemoglobin: 17 g/dL (ref 13.0–17.0)

## 2010-10-05 NOTE — Op Note (Signed)
NAMEMINH, ROANHORSE              ACCOUNT NO.:  [id number]  MEDICAL RECORD NO.:  [id number]  LOCATION:                                 FACILITY:  PHYSICIAN:  Zola Button T. Lazarus Salines, M.D.      DATE OF BIRTH:  DATE OF PROCEDURE:  10/02/2010 DATE OF DISCHARGE:                              OPERATIVE REPORT   PREOPERATIVE DIAGNOSIS:  Hypopharyngeal lesion, right throat pain.  POSTOPERATIVE DIAGNOSIS:  Hypopharyngeal lesion, right throat pain.  PROCEDURE PERFORMED:  Direct laryngoscopy and biopsy, esophagoscopy.  SURGEON:  Gloris Manchester. Idy Rawling, MD  ANESTHESIA:  General orotracheal.  BLOOD LOSS:  Minimal.  COMPLICATIONS:  None.  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  2+ embedded cryptic tonsils with tonsillolith debris.  No significant palpable styloid process or  stylohyoid ligament calcification.  No lymphadenopathy.  PROCEDURE:  With the patient in a comfortable supine position, general orotracheal anesthesia was induced without difficulty.  At an appropriate level, the table was turned 90 degrees and the patient was placed in a slight reverse Trendelenburg.  The head was extended in the "Rose" position.  A rubber tooth guard was placed.  Taking care to protect lips, teeth, and endotracheal tube, the anterior commissure laryngoscope was introduced and full inspection of the oropharynx, larynx and hypopharynx was performed.  The lesion seen on upper GI endoscopy by Dr. Arlyce Dice was identified in the right piriform sinus. After completing direct laryngoscopy, this was biopsied in several pieces with an up cup forceps.  Remarkably, a small amount of mucosa was stripped off but I really could not get a proper biteable lesion.  This was sent for frozen section interpretation.  There was some ecchymotic swelling but no active  bleeding.  The laryngoscope was removed.  The cervical esophagoscope was lubricated and inserted and passed readily through the cricopharyngeus and to its full length and then withdrawn with no significant findings.  The esophagoscope was removed from the field.  Both sides of the neck were carefully palpated.  The oral cavity, nasopharynx, oropharynx, base of tongue and hypopharynx were carefully palpated.  There was a small styloid process palpable on each side. There were cryptic tonsils with some tonsillolith debris on both sides. There were no specific lesions in the base of the tongue on either side.  With the rubber tooth guard back in place, the direct laryngoscope was used to inspect both tonsils with some cryptic debris but no other obvious abnormalities.  Note, that an Afrin pledget had been applied to the biopsy sites earlier.  By this time, the frozen section came back as squamous papilloma was a lymphoid aggregate.  The anterior commissure laryngoscope was reintroduced into the area and it was inspected once again.  The cottonoid was removed.  There were no additional lesions to biopsy and hemostasis was observed.  The laryngoscope was removed.  The rubber tooth guard was removed.  Dental status was intact.  The patient was returned to Anesthesia, awakened, extubated, and transferred to recovery in stable condition.  COMMENT:  A 45 year old  white male with a 61-month history of pain in the right throat with swallowing had an upper GI endoscopy showing a small lesion of uncertain significance in the region of the hypopharynx. Today's examination was to further inspect this lesion and perform a biopsy as above.  A benign squamous papilloma with lymphoid aggregates was noted.  No further attention to this area is required.  At this point, I suspect his throat discomfort may be coming from his cryptic tonsils and we will address this issue with him.  Given low  anticipated risk of postanesthetic or postsurgical complications, feel an outpatient venue is appropriate.     Gloris Manchester. Lazarus Salines, M.D.     KTW/MEDQ  D:  10/02/2010  T:  10/02/2010  Job:  [id number]  cc:   Dr. Candelaria Stagers  Electronically Signed by Flo Shanks M.D. on 10/05/2010 06:18:14 PM

## 2010-11-09 ENCOUNTER — Other Ambulatory Visit (INDEPENDENT_AMBULATORY_CARE_PROVIDER_SITE_OTHER): Payer: BC Managed Care – PPO

## 2010-11-09 DIAGNOSIS — Z0000 Encounter for general adult medical examination without abnormal findings: Secondary | ICD-10-CM

## 2010-11-09 LAB — BASIC METABOLIC PANEL
Calcium: 9 mg/dL (ref 8.4–10.5)
GFR: 90.29 mL/min (ref >60.00)
Sodium: 143 mEq/L (ref 135–145)

## 2010-11-09 LAB — HEPATIC FUNCTION PANEL
ALT: 29 U/L (ref 0–53)
AST: 28 U/L (ref 0–37)
Albumin: 4.7 g/dL (ref 3.5–5.2)
Alkaline Phosphatase: 52 U/L (ref 39–117)
Bilirubin, Direct: 0 mg/dL (ref 0.0–0.3)
Total Bilirubin: 0.7 mg/dL (ref 0.3–1.2)
Total Protein: 7.2 g/dL (ref 6.0–8.3)

## 2010-11-09 LAB — LIPID PANEL
Total CHOL/HDL Ratio: 4
VLDL: 12.8 mg/dL (ref 0.0–40.0)

## 2010-11-09 LAB — CBC WITH DIFFERENTIAL/PLATELET
Basophils Absolute: 0 10*3/uL (ref 0.0–0.1)
Basophils Relative: 0.3 % (ref 0.0–3.0)
Hemoglobin: 16 g/dL (ref 13.0–17.0)
Lymphocytes Relative: 32.8 % (ref 12.0–46.0)
Monocytes Relative: 9.7 % (ref 3.0–12.0)
Neutro Abs: 3.5 10*3/uL (ref 1.4–7.7)
RBC: 5.04 Mil/uL (ref 4.22–5.81)

## 2010-11-09 LAB — POCT URINALYSIS DIPSTICK
Bilirubin, UA: NEGATIVE
Blood, UA: NEGATIVE
Glucose, UA: NEGATIVE
Leukocytes, UA: NEGATIVE
Nitrite, UA: NEGATIVE
Urobilinogen, UA: 0.2

## 2010-11-09 LAB — LDL CHOLESTEROL, DIRECT: Direct LDL: 170.5 mg/dL

## 2010-11-16 ENCOUNTER — Ambulatory Visit (INDEPENDENT_AMBULATORY_CARE_PROVIDER_SITE_OTHER): Payer: BC Managed Care – PPO | Admitting: Family Medicine

## 2010-11-16 ENCOUNTER — Encounter: Payer: Self-pay | Admitting: Family Medicine

## 2010-11-16 DIAGNOSIS — M722 Plantar fascial fibromatosis: Secondary | ICD-10-CM

## 2010-11-16 DIAGNOSIS — Z0000 Encounter for general adult medical examination without abnormal findings: Secondary | ICD-10-CM

## 2010-11-16 DIAGNOSIS — E785 Hyperlipidemia, unspecified: Secondary | ICD-10-CM

## 2010-11-16 NOTE — Progress Notes (Signed)
  Subjective:    Patient ID: [name], male    DOB: 07-12-1965, 45 y.o.   MRN: [id number]  HPI       [name] is a 45 -year-old, married man, nonsmoker, who comes in today for general physical examination  Please always been in excellent health.  He said no chronic health problems except for hyperlipidemia, reflux, esophagitis, and a new problem of plantar fasciitis, right foot.  He also had some dysphasia.  This when her ENT consult after a normal GI exam showed a polyp, which was removed.  It was behind his focal cords, and fortunately, benign.  He takes Prilosec 20 mg daily for reflux esophagitis.  He takes Crestor 10 mg nightly for hyperlipidemia, however, he stopped his medicine 3 months ago.  LDL went from 101 to 170 encouraged to restart medication.  He had no side effects.  His plantar fasciitis, right foot.  He's tried many different therapies, including a chiropractor.  Will refer to [name]    Review of Systems  Constitutional: Negative.   HENT: Negative.   Eyes: Negative.   Respiratory: Negative.   Cardiovascular: Negative.   Gastrointestinal: Negative.   Genitourinary: Negative.   Musculoskeletal: Negative.   Skin: Negative.   Neurological: Negative.   Hematological: Negative.   Psychiatric/Behavioral: Negative.        Objective:   Physical Exam  Constitutional: He is oriented to person, place, and time. He appears well-developed and well-nourished.  HENT:  Head: Normocephalic and atraumatic.  Right Ear: External ear normal.  Left Ear: External ear normal.  Nose: Nose normal.  Mouth/Throat: Oropharynx is clear and moist.  Eyes: Conjunctivae and EOM are normal. Pupils are equal, round, and reactive to light.  Neck: Normal range of motion. Neck supple. No JVD present. No tracheal deviation present. No thyromegaly present.  Cardiovascular: Normal rate, regular rhythm, normal heart sounds and intact distal pulses.  Exam reveals no gallop and no friction rub.    No murmur heard. Pulmonary/Chest: Effort normal and breath sounds normal. No stridor. No respiratory distress. He has no wheezes. He has no rales. He exhibits no tenderness.  Abdominal: Soft. Bowel sounds are normal. He exhibits no distension and no mass. There is no tenderness. There is no rebound and no guarding.  Genitourinary: Rectum normal, prostate normal and penis normal. Guaiac negative stool. No penile tenderness.  Musculoskeletal: Normal range of motion. He exhibits no edema and no tenderness.  Lymphadenopathy:    He has no cervical adenopathy.  Neurological: He is alert and oriented to person, place, and time. He has normal reflexes. No cranial nerve deficit. He exhibits normal muscle tone.  Skin: Skin is warm and dry. No rash noted. No erythema. No pallor.  Psychiatric: He has a normal mood and affect. His behavior is normal. Judgment and thought content normal.          Assessment & Plan:  Healthy male.  Hyperlipidemia.  Restart Crestor 10 mg nightly continue 81-mg baby aspirin.  Reflux esophagitis.  Continue OTC Prilosec.  Plantar fasciitis, right foot refer to [name]

## 2010-11-16 NOTE — Patient Instructions (Signed)
Continue the aspirin and Prilosec.  Restart the Crestor 10 mg daily at bedtime.  We will get to set up a time to see [name], physical therapist for evaluation and treatment of the plantar fasciitis.  Return in one year or sooner if any problem

## 2010-11-21 ENCOUNTER — Telehealth: Payer: Self-pay | Admitting: *Deleted

## 2010-11-21 MED ORDER — ROSUVASTATIN CALCIUM 10 MG PO TABS: 10.0000 mg | ORAL_TABLET | Freq: Every day | ORAL | Status: DC

## 2010-11-21 NOTE — Telephone Encounter (Signed)
Pt states [facility]rest[facility] was [facility] [address] [facility].  Pt is at pharmacy and rx has not been sent in.

## 2010-11-22 ENCOUNTER — Other Ambulatory Visit: Payer: Self-pay | Admitting: *Deleted

## 2011-01-22 LAB — CBC
Hemoglobin: 14.9
MCHC: 34.5
RBC: 4.84
WBC: 9.8

## 2011-01-22 LAB — BASIC METABOLIC PANEL
BUN: 14
Calcium: 9.3
Chloride: 104
Creatinine, Ser: 0.77
GFR calc Af Amer: >60

## 2011-04-03 ENCOUNTER — Telehealth: Payer: Self-pay | Admitting: Family Medicine

## 2011-04-03 NOTE — Telephone Encounter (Signed)
Pt called back to check on status of getting work in [employer] for sorethroat x 7 days. If ov is not available, [facility] [address].

## 2011-04-03 NOTE — Telephone Encounter (Signed)
Pt is experiencing a head cold/ sore throat x7 day and would like to be seen today please advise

## 2011-04-03 NOTE — Telephone Encounter (Signed)
Spoke with patient and an appointment made 

## 2011-04-04 ENCOUNTER — Encounter: Payer: Self-pay | Admitting: Family Medicine

## 2011-04-04 ENCOUNTER — Ambulatory Visit (INDEPENDENT_AMBULATORY_CARE_PROVIDER_SITE_OTHER): Payer: BC Managed Care – PPO | Admitting: Family Medicine

## 2011-04-04 VITALS — BP 120/80 | Temp 98.3°F | Wt 204.0 lb

## 2011-04-04 DIAGNOSIS — J069 Acute upper respiratory infection, unspecified: Secondary | ICD-10-CM | POA: Insufficient documentation

## 2011-04-04 MED ORDER — HYDROCODONE-HOMATROPINE 5-1.5 MG/5ML PO SYRP: ORAL_SOLUTION | ORAL | Status: DC

## 2011-04-04 NOTE — Progress Notes (Signed)
  Subjective:    Patient ID: [name], male    DOB: 07-Sep-1965, 45 y.o.   MRN: [id number]  HPID. , 45 year old, married male, nonsmoker, who comes in today for evaluation Of sore throat, head congestion, and cough for one week.  No fever no sputum production.  No history of previous pulmonary disease and he is a nonsmoker    Review of Systems    General review of systems negative Objective:   Physical Exam Well-developed well-nourished, male in no acute distress.  HEENT negative.  Neck supple.  No adenopathy.  Lungs are clear       Assessment & Plan:  Viral syndrome, transient.  Plan treat symptomatically

## 2011-04-04 NOTE — Patient Instructions (Signed)
Drink lots of liquids.  Hydromet one half to 1 teaspoon at bedtime p.r.n. For cough.  Return p.r.n.

## 2011-08-09 ENCOUNTER — Encounter: Payer: Self-pay | Admitting: Family Medicine

## 2011-08-09 ENCOUNTER — Ambulatory Visit (INDEPENDENT_AMBULATORY_CARE_PROVIDER_SITE_OTHER): Payer: BC Managed Care – PPO | Admitting: Family Medicine

## 2011-08-09 VITALS — BP 120/78 | Temp 98.0°F | Wt 205.0 lb

## 2011-08-09 DIAGNOSIS — R1310 Dysphagia, unspecified: Secondary | ICD-10-CM

## 2011-08-09 NOTE — Patient Instructions (Signed)
C. Ear nose and throat doctor today

## 2011-08-09 NOTE — Progress Notes (Signed)
  Subjective:    Patient ID: [name], male    DOB: 1965-12-02, 46 y.o.   MRN: [id number]  HPI Harun is a 45 year old male married nonsmoker who comes in today for evaluation of severe pain in the left side of his throat and inability to swallow  His symptoms started last night. This morning he tried to swallow a pill and it wouldn't go down.   Review of Systems    general and ENT review of systems otherwise negative Objective:   Physical Exam  Well-developed well-nourished male in no acute distress oral cavity normal neck normal      Assessment & Plan:  Dysphasia with severe neck pain referred back to ENT today

## 2011-09-10 ENCOUNTER — Other Ambulatory Visit (INDEPENDENT_AMBULATORY_CARE_PROVIDER_SITE_OTHER): Payer: BC Managed Care – PPO

## 2011-09-10 DIAGNOSIS — Z0000 Encounter for general adult medical examination without abnormal findings: Secondary | ICD-10-CM

## 2011-09-10 LAB — BASIC METABOLIC PANEL
BUN: 15 mg/dL (ref 6–23)
CO2: 24 mEq/L (ref 19–32)
Chloride: 103 mEq/L (ref 96–112)
GFR: 97.01 mL/min (ref >60.00)
Glucose, Bld: 98 mg/dL (ref 70–99)
Potassium: 4 mEq/L (ref 3.5–5.1)
Sodium: 140 mEq/L (ref 135–145)

## 2011-09-10 LAB — POCT URINALYSIS DIPSTICK
Leukocytes, UA: NEGATIVE
Spec Grav, UA: 1.025

## 2011-09-10 LAB — HEPATIC FUNCTION PANEL
ALT: 37 U/L (ref 0–53)
AST: 30 U/L (ref 0–37)
Albumin: 4.2 g/dL (ref 3.5–5.2)

## 2011-09-10 LAB — CBC WITH DIFFERENTIAL/PLATELET
Basophils Absolute: 0.1 10*3/uL (ref 0.0–0.1)
HCT: 46.3 % (ref 39.0–52.0)
Hemoglobin: 15.7 g/dL (ref 13.0–17.0)
Lymphs Abs: 2.4 10*3/uL (ref 0.7–4.0)
MCV: 91.7 fl (ref 78.0–100.0)
Monocytes Absolute: 0.7 10*3/uL (ref 0.1–1.0)
Monocytes Relative: 8.9 % (ref 3.0–12.0)
Neutro Abs: 4.6 10*3/uL (ref 1.4–7.7)
Platelets: 245 10*3/uL (ref 150.0–400.0)
RDW: 13.1 % (ref 11.5–14.6)

## 2011-09-10 LAB — LIPID PANEL
Cholesterol: 188 mg/dL (ref 0–200)
HDL: 45.2 mg/dL (ref >39.00)

## 2011-09-10 LAB — TSH: TSH: 2.01 u[IU]/mL (ref 0.35–5.50)

## 2011-09-17 ENCOUNTER — Ambulatory Visit (INDEPENDENT_AMBULATORY_CARE_PROVIDER_SITE_OTHER): Payer: BC Managed Care – PPO | Admitting: Family Medicine

## 2011-09-17 ENCOUNTER — Encounter: Payer: Self-pay | Admitting: Family Medicine

## 2011-09-17 VITALS — BP 120/80 | Temp 98.0°F | Ht 70.5 in | Wt 206.0 lb

## 2011-09-17 DIAGNOSIS — R5383 Other fatigue: Secondary | ICD-10-CM | POA: Insufficient documentation

## 2011-09-17 DIAGNOSIS — R5381 Other malaise: Secondary | ICD-10-CM

## 2011-09-17 DIAGNOSIS — E785 Hyperlipidemia, unspecified: Secondary | ICD-10-CM

## 2011-09-17 NOTE — Patient Instructions (Signed)
Stop the Crestor and aspirin  Call me in 4 weeks with a progress report

## 2011-09-17 NOTE — Progress Notes (Signed)
  Subjective:    Patient ID: [name], male    DOB: March 10, 1966, 46 y.o.   MRN: [id number]  HPI [name] is a 46 year old married male nonsmoker who comes in today for general physical examination  He takes 10 mg of Crestor and aspirin tablet daily because of a history of hyperlipidemia lipids are at goal LFTs normal  He states in the last 8 months he's been fatigued. He describes this as being able to sleep around 8:30 every night. No history of depression. He's otherwise been well. Family history dad died at 68 from exposure to Agent Orange mom died in her 72s from complications of leukemia 2 brothers one has MS another died of a drug overdose 4 sisters in good health  Social history normal family history normal sex life normal    Review of Systems  Constitutional: Negative.   HENT: Negative.   Eyes: Negative.   Respiratory: Negative.   Cardiovascular: Negative.   Gastrointestinal: Negative.   Genitourinary: Negative.   Musculoskeletal: Negative.   Skin: Negative.   Neurological: Negative.   Hematological: Negative.   Psychiatric/Behavioral: Negative.        Objective:   Physical Exam  Constitutional: He is oriented to person, place, and time. He appears well-developed and well-nourished.  HENT:  Head: Normocephalic and atraumatic.  Right Ear: External ear normal.  Left Ear: External ear normal.  Nose: Nose normal.  Mouth/Throat: Oropharynx is clear and moist.  Eyes: Conjunctivae and EOM are normal. Pupils are equal, round, and reactive to light.  Neck: Normal range of motion. Neck supple. No JVD present. No tracheal deviation present. No thyromegaly present.  Cardiovascular: Normal rate, regular rhythm, normal heart sounds and intact distal pulses.  Exam reveals no gallop and no friction rub.   No murmur heard. Pulmonary/Chest: Effort normal and breath sounds normal. No stridor. No respiratory distress. He has no wheezes. He has no rales. He exhibits no tenderness.    Abdominal: Soft. Bowel sounds are normal. He exhibits no distension and no mass. There is no tenderness. There is no rebound and no guarding.  Genitourinary: Rectum normal, prostate normal and penis normal. Guaiac negative stool. No penile tenderness.  Musculoskeletal: Normal range of motion. He exhibits no edema and no tenderness.  Lymphadenopathy:    He has no cervical adenopathy.  Neurological: He is alert and oriented to person, place, and time. He has normal reflexes. No cranial nerve deficit. He exhibits normal muscle tone.  Skin: Skin is warm and dry. No rash noted. No erythema. No pallor.  Psychiatric: He has a normal mood and affect. His behavior is normal. Judgment and thought content normal.          Assessment & Plan:  Healthy male  Fatigue unknown etiology plan DC Crestor and aspirin return when necessary

## 2011-09-25 ENCOUNTER — Telehealth: Payer: Self-pay | Admitting: Family Medicine

## 2011-09-25 NOTE — Telephone Encounter (Signed)
Pt has a sore throat and it is hard to swallow and  Is requesting to be seen today before 3:00 or have something called in. Please advise

## 2011-09-25 NOTE — Telephone Encounter (Signed)
Work in on Wednesday

## 2011-09-25 NOTE — Telephone Encounter (Signed)
Left message on machine for patient to return our call 

## 2011-09-25 NOTE — Telephone Encounter (Signed)
Please call work and Wednesday

## 2011-09-26 NOTE — Telephone Encounter (Signed)
Left message on machine for patient to return our call 

## 2011-09-27 ENCOUNTER — Telehealth: Payer: Self-pay | Admitting: Family Medicine

## 2011-09-27 ENCOUNTER — Ambulatory Visit: Payer: BC Managed Care – PPO | Admitting: Family Medicine

## 2011-09-27 NOTE — Telephone Encounter (Signed)
Sore throat no appt available. Please assist.

## 2011-09-27 NOTE — Telephone Encounter (Signed)
Okay to work in at the end of the day 

## 2011-10-01 ENCOUNTER — Encounter: Payer: Self-pay | Admitting: Family Medicine

## 2011-10-01 ENCOUNTER — Ambulatory Visit (INDEPENDENT_AMBULATORY_CARE_PROVIDER_SITE_OTHER): Payer: BC Managed Care – PPO | Admitting: Family Medicine

## 2011-10-01 VITALS — BP 120/80 | Temp 98.1°F

## 2011-10-01 DIAGNOSIS — R070 Pain in throat: Secondary | ICD-10-CM

## 2011-10-01 DIAGNOSIS — J309 Allergic rhinitis, unspecified: Secondary | ICD-10-CM

## 2011-10-01 MED ORDER — PREDNISONE 20 MG PO TABS: ORAL_TABLET | ORAL | Status: DC

## 2011-10-01 NOTE — Patient Instructions (Signed)
Start the prednisone today for your allergy symptoms  Call the ENT office and asked to be seen for followup this week since your throat pain is not better

## 2011-10-01 NOTE — Progress Notes (Signed)
  Subjective:    Patient ID: [name], male    DOB: December 31, 1965, 46 y.o.   MRN: [id number]  HPI [name]  a 46 year old male who comes in today for evaluation of 2 problems  He states he is allergic rhinitis and has been taken over-the-counter medicines and nothing scalp for the past month.  He also went to see an ear nose and throat specialist because of severe sore throat pain. Diagnostic workup showed a swollen vocal cord. He was advised voice rest and given a six-day pack of prednisone. Prior to that he had gone to an urgent care and was given an antibiotic which didn't help  Over the weekend he rested his voice and didn't talk the pain diminished but now that he talking again he pains worse    Review of Systems General and ENT review of systems otherwise negative    Objective:   Physical Exam  well-developed and nourished man in acute distress HEENT negative neck was supple no adenopathy thyroid normal       Assessment & Plan:  Allergic rhinitis plan prednisone burst and taper  And inflamed left vocal cord persistent pain.........Marland Kitchen recheck and reconsult with ENT.

## 2012-02-19 ENCOUNTER — Ambulatory Visit (INDEPENDENT_AMBULATORY_CARE_PROVIDER_SITE_OTHER): Payer: BC Managed Care – PPO | Admitting: Family Medicine

## 2012-02-19 ENCOUNTER — Encounter: Payer: Self-pay | Admitting: Family Medicine

## 2012-02-19 VITALS — BP 110/62 | Temp 97.6°F | Wt 206.0 lb

## 2012-02-19 DIAGNOSIS — K645 Perianal venous thrombosis: Secondary | ICD-10-CM | POA: Insufficient documentation

## 2012-02-19 MED ORDER — HYDROCORTISONE 2.5 % RE CREA: TOPICAL_CREAM | Freq: Two times a day (BID) | RECTAL | Status: DC

## 2012-02-19 NOTE — Patient Instructions (Signed)
Take a stool softener on a daily basis  Soak in hot water twice daily for 10 minutes and apply the medicated ointment or cream  Return when necessary

## 2012-02-19 NOTE — Progress Notes (Signed)
  Subjective:    Patient ID: [name], male    DOB: [birth year], 46 y.o.   MRN: [id number]  HPI  [name] a 46 year old male who woke up on Sunday morning with a lump around his rectum. No history of trauma no history of constipation no history of previous hemorrhoids. Pain is a 3 on a scale of 1-10 no bleeding  Review of Systems    general and GI review of systems otherwise negative Objective:   Physical Exam Well-developed well-nourished male in no acute distress examination rectum shows a marble sized thrombosed hemorrhoid at the 9:00 position       Assessment & Plan:  Thrombosed hemorrhoid plan soaks medicated cream return when necessary

## 2012-10-20 ENCOUNTER — Other Ambulatory Visit (INDEPENDENT_AMBULATORY_CARE_PROVIDER_SITE_OTHER): Payer: BC Managed Care – PPO

## 2012-10-20 DIAGNOSIS — Z0000 Encounter for general adult medical examination without abnormal findings: Secondary | ICD-10-CM

## 2012-10-20 LAB — CBC WITH DIFFERENTIAL/PLATELET
Basophils Relative: 0.4 % (ref 0.0–3.0)
Eosinophils Relative: 1.1 % (ref 0.0–5.0)
HCT: 45.9 % (ref 39.0–52.0)
Hemoglobin: 15.7 g/dL (ref 13.0–17.0)
Lymphs Abs: 2.3 10*3/uL (ref 0.7–4.0)
MCV: 91.9 fl (ref 78.0–100.0)
Monocytes Relative: 9.1 % (ref 3.0–12.0)
Neutro Abs: 4 10*3/uL (ref 1.4–7.7)
RBC: 4.99 Mil/uL (ref 4.22–5.81)
WBC: 6.9 10*3/uL (ref 4.5–10.5)

## 2012-10-20 LAB — BASIC METABOLIC PANEL
BUN: 16 mg/dL (ref 6–23)
CO2: 26 mEq/L (ref 19–32)
Calcium: 9.4 mg/dL (ref 8.4–10.5)
Creatinine, Ser: 0.9 mg/dL (ref 0.4–1.5)
Glucose, Bld: 114 mg/dL — ABNORMAL HIGH (ref 70–99)
Sodium: 142 mEq/L (ref 135–145)

## 2012-10-20 LAB — POCT URINALYSIS DIPSTICK
Glucose, UA: NEGATIVE
Ketones, UA: NEGATIVE
Leukocytes, UA: NEGATIVE
Protein, UA: NEGATIVE

## 2012-10-20 LAB — HEPATIC FUNCTION PANEL
Albumin: 4.3 g/dL (ref 3.5–5.2)
Alkaline Phosphatase: 48 U/L (ref 39–117)

## 2012-10-20 LAB — LIPID PANEL: HDL: 49.7 mg/dL (ref >39.00)

## 2012-10-20 LAB — LDL CHOLESTEROL, DIRECT: Direct LDL: 170.1 mg/dL

## 2012-10-20 LAB — TSH: TSH: 1.58 u[IU]/mL (ref 0.35–5.50)

## 2012-10-27 ENCOUNTER — Encounter: Payer: BC Managed Care – PPO | Admitting: Family Medicine

## 2012-11-28 ENCOUNTER — Ambulatory Visit (INDEPENDENT_AMBULATORY_CARE_PROVIDER_SITE_OTHER): Payer: BC Managed Care – PPO | Admitting: Family

## 2012-11-28 ENCOUNTER — Encounter: Payer: Self-pay | Admitting: Family

## 2012-11-28 VITALS — BP 120/80 | HR 60 | Ht 69.5 in | Wt 198.0 lb

## 2012-11-28 DIAGNOSIS — Z0000 Encounter for general adult medical examination without abnormal findings: Secondary | ICD-10-CM

## 2012-11-28 DIAGNOSIS — E7800 Pure hypercholesterolemia, unspecified: Secondary | ICD-10-CM

## 2012-11-28 NOTE — Progress Notes (Signed)
Subjective:    Patient ID: [name], male    DOB: 07-07-1965, 47 y.o.   MRN: [id number]  HPI 47 year old male, nonsmoker, patient of Dr. Tawanna Cooler is in for complete physical exam.  Patient presents for yearly preventative medicine examination. All immunizations and health maintenance protocols were reviewed with the patient and they are up to date with these protocols. Screening laboratory values were reviewed with the patient including screening of hyperlipidemia PSA renal function and hepatic function. There medications past medical history social history problem list and allergies were reviewed in detail.  Goals were established with regard to weight loss exercise diet in compliance with medications   Review of Systems  Constitutional: Negative.   HENT: Negative.   Eyes: Negative.   Respiratory: Negative.   Cardiovascular: Negative.   Gastrointestinal: Negative.   Endocrine: Negative.   Genitourinary: Negative.   Musculoskeletal: Negative.   Skin: Negative.   Allergic/Immunologic: Negative.   Neurological: Negative.   Hematological: Negative.   Psychiatric/Behavioral: Negative.    Past Medical History  Diagnosis Date  . Hyperlipemia   . Allergic rhinitis     History   Social History  . Marital Status: Married    Spouse Name: N/A    Number of Children: 3  . Years of Education: N/A   Occupational History  . self employed    Social History Main Topics  . Smoking status: Never Smoker   . Smokeless tobacco: Never Used  . Alcohol Use: No  . Drug Use: No  . Sexually Active: Not on file   Other Topics Concern  . Not on file   Social History Narrative  . No narrative on file    Past Surgical History  Procedure Laterality Date  . Appendectomy  1990  . Wrist surgery      Left    Family History  Problem Relation Age of Onset  . Leukemia Mother   . Diabetes Mother   . Lung cancer Father   . Breast cancer Mother   . Colon cancer Neg Hx     No Known  Allergies  Current Outpatient Prescriptions on File Prior to Visit  Medication Sig Dispense Refill  . aspirin 81 MG tablet Take 81 mg by mouth daily.        . hydrocortisone (ANUSOL-HC) 2.5 % rectal cream Place rectally 2 (two) times daily.  30 g  2  . HYDROcodone-acetaminophen (VICODIN ES) 7.5-750 MG per tablet Take 1 tablet by mouth every 6 (six) hours as needed.      . predniSONE (DELTASONE) 20 MG tablet 2 tabs x3 days, 1 tab x3 days, a half a tab x3 days, then a half a tablet Monday Wednesday Friday for a 2 week taper  30 tablet  1  . rosuvastatin (CRESTOR) 10 MG tablet Take 1 tablet (10 mg total) by mouth daily.  90 tablet  3   Current Facility-Administered Medications on File Prior to Visit  Medication Dose Route Frequency Provider Last Rate Last Dose  . 0.9 %  sodium chloride infusion  500 mL Intravenous Continuous Louis Meckel, MD        BP 120/80  Pulse 60  Ht 5' 9.5" (1.765 m)  Wt 198 lb (89.812 kg)  BMI 28.83 kg/m2  SpO2 97%chart    Objective:   Physical Exam  Constitutional: He is oriented to person, place, and time. He appears well-developed and well-nourished.  HENT:  Head: Normocephalic.  Right Ear: External ear normal.  Left Ear:  External ear normal.  Nose: Nose normal.  Mouth/Throat: Oropharynx is clear and moist.  Eyes: Conjunctivae and EOM are normal. Pupils are equal, round, and reactive to light.  Neck: Normal range of motion. Neck supple. No thyromegaly present.  Cardiovascular: Normal rate, regular rhythm and normal heart sounds.   Pulmonary/Chest: Effort normal and breath sounds normal.  Abdominal: Soft. Bowel sounds are normal.  Musculoskeletal: Normal range of motion.  Neurological: He is alert and oriented to person, place, and time. He has normal reflexes. He displays normal reflexes. No cranial nerve deficit. Coordination normal.  Skin: Skin is warm and dry.  Psychiatric: He has a normal mood and affect.          Assessment & Plan:   Assessment: 1. Complete physical exam 2. Hyperlipidemia -- uncontrolled  Plan: 3 months of lifestyle modifications. I have advised patient that he may have to go back on Crestor for his cholesterol.

## 2012-11-28 NOTE — Patient Instructions (Signed)

## 2012-12-23 ENCOUNTER — Encounter: Payer: BC Managed Care – PPO | Admitting: Family Medicine

## 2013-02-27 ENCOUNTER — Other Ambulatory Visit (INDEPENDENT_AMBULATORY_CARE_PROVIDER_SITE_OTHER): Payer: BC Managed Care – PPO

## 2013-02-27 ENCOUNTER — Encounter: Payer: Self-pay | Admitting: Family Medicine

## 2013-02-27 DIAGNOSIS — E785 Hyperlipidemia, unspecified: Secondary | ICD-10-CM

## 2013-02-27 LAB — LIPID PANEL
Cholesterol: 252 mg/dL — ABNORMAL HIGH (ref 0–200)
HDL: 47.6 mg/dL
Total CHOL/HDL Ratio: 5
Triglycerides: 75 mg/dL (ref 0.0–149.0)
VLDL: 15 mg/dL (ref 0.0–40.0)

## 2013-02-27 LAB — LDL CHOLESTEROL, DIRECT: Direct LDL: 199.7 mg/dL

## 2013-03-19 ENCOUNTER — Telehealth: Payer: Self-pay | Admitting: Family Medicine

## 2013-03-19 NOTE — Telephone Encounter (Signed)
Pt requesting results of labs. Please contact °

## 2013-03-30 NOTE — Telephone Encounter (Signed)
Pt has called several times for results and would like someone to call him about these. Thanks.

## 2013-03-30 NOTE — Telephone Encounter (Signed)
Patient is aware of lab results and copy mailed to home address at the request of the patient.

## 2013-08-27 ENCOUNTER — Ambulatory Visit (INDEPENDENT_AMBULATORY_CARE_PROVIDER_SITE_OTHER): Payer: BC Managed Care – PPO | Admitting: Family Medicine

## 2013-08-27 ENCOUNTER — Encounter: Payer: Self-pay | Admitting: Family Medicine

## 2013-08-27 VITALS — BP 130/80 | Temp 98.1°F | Wt 204.0 lb

## 2013-08-27 DIAGNOSIS — L301 Dyshidrosis [pompholyx]: Secondary | ICD-10-CM

## 2013-08-27 MED ORDER — TRIAMCINOLONE ACETONIDE 0.025 % EX OINT: 1.0000 "application " | TOPICAL_OINTMENT | Freq: Two times a day (BID) | CUTANEOUS | Status: DC

## 2013-08-27 NOTE — Progress Notes (Signed)
Pre visit review using our clinic review tool, if applicable. No additional management support is needed unless otherwise documented below in the visit note. 

## 2013-08-27 NOTE — Progress Notes (Signed)
   Subjective:    Patient ID: [name], male    DOB: 09-Oct-1965, 48 y.o.   MRN: [id number]  HPI  [name] is a 48 year old male who comes in today with a one-day history of a skin rash  This morning he noticed some round to oval lesions on his left hip that extend around the waist. The nonperiodic. He has a history of allergic rhinitis  Review of Systems    review of systems otherwise negative no recent tick exposure etc. no fever Objective:   Physical Exam Well-developed well-nourished male no acute distress vital signs stable he is afebrile examination the abdomen shows round to oblong-shaped lesions consistent with dyshidrotic eczema       Assessment & Plan:  Eczema plan treat with steroid gel

## 2013-08-27 NOTE — Patient Instructions (Signed)
Triamcinolone gel........... small amounts twice daily

## 2013-12-22 ENCOUNTER — Other Ambulatory Visit (INDEPENDENT_AMBULATORY_CARE_PROVIDER_SITE_OTHER): Payer: BC Managed Care – PPO

## 2013-12-22 DIAGNOSIS — Z0000 Encounter for general adult medical examination without abnormal findings: Secondary | ICD-10-CM

## 2013-12-22 LAB — HEPATIC FUNCTION PANEL
ALBUMIN: 4.3 g/dL (ref 3.5–5.2)
ALK PHOS: 60 U/L (ref 39–117)
ALT: 44 U/L (ref 0–53)
AST: 34 U/L (ref 0–37)
BILIRUBIN DIRECT: 0.1 mg/dL (ref 0.0–0.3)
TOTAL PROTEIN: 7.2 g/dL (ref 6.0–8.3)
Total Bilirubin: 1.2 mg/dL (ref 0.2–1.2)

## 2013-12-22 LAB — POCT URINALYSIS DIPSTICK
Bilirubin, UA: NEGATIVE
Glucose, UA: NEGATIVE
KETONES UA: NEGATIVE
LEUKOCYTES UA: NEGATIVE
Nitrite, UA: NEGATIVE
PH UA: 6
PROTEIN UA: NEGATIVE
Spec Grav, UA: 1.015
UROBILINOGEN UA: 0.2

## 2013-12-22 LAB — LIPID PANEL
CHOL/HDL RATIO: 5
CHOLESTEROL: 239 mg/dL — AB (ref 0–200)
HDL: 47.6 mg/dL (ref >39.00)
LDL CALC: 172 mg/dL — AB (ref 0–99)
NonHDL: 191.4
Triglycerides: 99 mg/dL (ref 0.0–149.0)
VLDL: 19.8 mg/dL (ref 0.0–40.0)

## 2013-12-22 LAB — CBC WITH DIFFERENTIAL/PLATELET
BASOS PCT: 0.5 % (ref 0.0–3.0)
Basophils Absolute: 0 10*3/uL (ref 0.0–0.1)
EOS ABS: 0.1 10*3/uL (ref 0.0–0.7)
EOS PCT: 1.4 % (ref 0.0–5.0)
HCT: 48 % (ref 39.0–52.0)
HEMOGLOBIN: 16.3 g/dL (ref 13.0–17.0)
LYMPHS PCT: 25.3 % (ref 12.0–46.0)
Lymphs Abs: 2 10*3/uL (ref 0.7–4.0)
MCHC: 33.9 g/dL (ref 30.0–36.0)
MCV: 91.7 fl (ref 78.0–100.0)
MONOS PCT: 10.5 % (ref 3.0–12.0)
Monocytes Absolute: 0.8 10*3/uL (ref 0.1–1.0)
NEUTROS ABS: 4.8 10*3/uL (ref 1.4–7.7)
NEUTROS PCT: 62.3 % (ref 43.0–77.0)
Platelets: 264 10*3/uL (ref 150.0–400.0)
RBC: 5.23 Mil/uL (ref 4.22–5.81)
RDW: 12.8 % (ref 11.5–15.5)
WBC: 7.7 10*3/uL (ref 4.0–10.5)

## 2013-12-22 LAB — BASIC METABOLIC PANEL
BUN: 12 mg/dL (ref 6–23)
CHLORIDE: 104 meq/L (ref 96–112)
CO2: 29 meq/L (ref 19–32)
CREATININE: 0.9 mg/dL (ref 0.4–1.5)
Calcium: 9.6 mg/dL (ref 8.4–10.5)
GFR: 96.09 mL/min (ref >60.00)
GLUCOSE: 92 mg/dL (ref 70–99)
POTASSIUM: 4.4 meq/L (ref 3.5–5.1)
Sodium: 140 mEq/L (ref 135–145)

## 2013-12-22 LAB — TSH: TSH: 2.66 u[IU]/mL (ref 0.35–4.50)

## 2013-12-22 LAB — PSA: PSA: 2.77 ng/mL (ref 0.10–4.00)

## 2013-12-29 ENCOUNTER — Encounter: Payer: Self-pay | Admitting: Family Medicine

## 2013-12-29 ENCOUNTER — Ambulatory Visit (INDEPENDENT_AMBULATORY_CARE_PROVIDER_SITE_OTHER): Payer: BC Managed Care – PPO | Admitting: Family Medicine

## 2013-12-29 VITALS — BP 150/80 | Temp 98.1°F | Ht 70.5 in | Wt 203.0 lb

## 2013-12-29 DIAGNOSIS — H833X9 Noise effects on inner ear, unspecified ear: Secondary | ICD-10-CM

## 2013-12-29 DIAGNOSIS — Z0000 Encounter for general adult medical examination without abnormal findings: Secondary | ICD-10-CM

## 2013-12-29 DIAGNOSIS — E7800 Pure hypercholesterolemia, unspecified: Secondary | ICD-10-CM

## 2013-12-29 DIAGNOSIS — H833X3 Noise effects on inner ear, bilateral: Secondary | ICD-10-CM

## 2013-12-29 DIAGNOSIS — M722 Plantar fascial fibromatosis: Secondary | ICD-10-CM

## 2013-12-29 NOTE — Patient Instructions (Signed)
Fat-free diet,,,,,,,, walk 30 minutes daily followup fasting lipid panel in 3 months  We will get you set up a consult with [name] for treatment of plantar fasciitis  Operative note in for GI they will call you about a colonoscopy  High-frequency hearing loss,,,,,,,,, noise protection ,,,,,,,,,,, you may consider a hearing aid ,,,,,,,,,,[employer]  Return in one year sooner if any problems ,,,,,,,,,

## 2013-12-29 NOTE — Progress Notes (Signed)
   Subjective:    Patient ID: [name], male    DOB: 1965-08-09, 48 y.o.   MRN: [id number]  HPI Coulter is a 48 year old male who comes in today for general physical examination  He gets routine eye care, dental care, had a colonoscopy 7 years ago by GI was noted to have some polyps. He may be due for followup colonoscopy. I will send a message to GI  Blood pressure was slightly elevated today 150/80 will repeat  He's had a scratch of decreased hearing right and left over the last couple years. He is a big time 100 and does not use ear protection  He continues to have pain in that left plantar fascia despite conservative therapy  Family history dad died from complications of Agent Orange mother died in her 31s of cancer no brothers no sisters. No family history of hypertension  Tetanus booster 2010  Declines a flu shot   Review of Systems  Constitutional: Negative.   HENT: Negative.   Eyes: Negative.   Respiratory: Negative.   Cardiovascular: Negative.   Gastrointestinal: Negative.   Genitourinary: Negative.   Musculoskeletal: Negative.   Skin: Negative.   Neurological: Negative.   Psychiatric/Behavioral: Negative.        Objective:   Physical Exam  Nursing note and vitals reviewed. Constitutional: He is oriented to person, place, and time. He appears well-developed and well-nourished.  HENT:  Head: Normocephalic and atraumatic.  Right Ear: External ear normal.  Left Ear: External ear normal.  Nose: Nose normal.  Mouth/Throat: Oropharynx is clear and moist.  The scars right and an eardrum some a left  Screening audiogram normal  Eyes: Conjunctivae and EOM are normal. Pupils are equal, round, and reactive to light.  Neck: Normal range of motion. Neck supple. No JVD present. No tracheal deviation present. No thyromegaly present.  Cardiovascular: Normal rate, regular rhythm, normal heart sounds and intact distal pulses.  Exam reveals no gallop and no friction  rub.   No murmur heard. Pulmonary/Chest: Effort normal and breath sounds normal. No stridor. No respiratory distress. He has no wheezes. He has no rales. He exhibits no tenderness.  Abdominal: Soft. Bowel sounds are normal. He exhibits no distension and no mass. There is no tenderness. There is no rebound and no guarding.  Genitourinary: Rectum normal, prostate normal and penis normal. Guaiac negative stool. No penile tenderness.  Musculoskeletal: Normal range of motion. He exhibits no edema and no tenderness.  Lymphadenopathy:    He has no cervical adenopathy.  Neurological: He is alert and oriented to person, place, and time. He has normal reflexes. No cranial nerve deficit. He exhibits normal muscle tone.  Skin: Skin is warm and dry. No rash noted. No erythema. No pallor.  Psychiatric: He has a normal mood and affect. His behavior is normal. Judgment and thought content normal.          Assessment & Plan:  Healthy male  High-frequency hearing loss........Marland Kitchen ear protection  Plantar fasciitis.......... unresolved with conservative therapy...Marland KitchenMarland KitchenMarland Kitchen referred her Noel Gerold physical therapist  Hyperlipidemia LDL 179......... diet exercise followup lipid panel in 3 months.

## 2013-12-29 NOTE — Progress Notes (Signed)
Pre visit review using our clinic review tool, if applicable. No additional management support is needed unless otherwise documented below in the visit note. 

## 2014-01-01 ENCOUNTER — Encounter: Payer: Self-pay | Admitting: Gastroenterology

## 2014-02-17 ENCOUNTER — Ambulatory Visit (AMBULATORY_SURGERY_CENTER): Payer: Self-pay | Admitting: *Deleted

## 2014-02-17 VITALS — Ht 71.0 in | Wt 203.4 lb

## 2014-02-17 DIAGNOSIS — Z1211 Encounter for screening for malignant neoplasm of colon: Secondary | ICD-10-CM

## 2014-02-17 MED ORDER — NA SULFATE-K SULFATE-MG SULF 17.5-3.13-1.6 GM/177ML PO SOLN: ORAL | Status: DC

## 2014-02-17 NOTE — Progress Notes (Signed)
No allergies to eggs or soy. No problems with anesthesia.  Pt given Emmi instructions for colonoscopy  No oxygen use  No diet drug use  

## 2014-02-22 ENCOUNTER — Encounter: Payer: Self-pay | Admitting: Gastroenterology

## 2014-03-03 ENCOUNTER — Encounter: Payer: Self-pay | Admitting: Gastroenterology

## 2014-03-03 ENCOUNTER — Ambulatory Visit (AMBULATORY_SURGERY_CENTER): Payer: BC Managed Care – PPO | Admitting: Gastroenterology

## 2014-03-03 VITALS — BP 130/74 | HR 50 | Temp 95.2°F | Resp 16 | Ht 71.0 in | Wt 203.0 lb

## 2014-03-03 DIAGNOSIS — Z1211 Encounter for screening for malignant neoplasm of colon: Secondary | ICD-10-CM

## 2014-03-03 MED ORDER — SODIUM CHLORIDE 0.9 % IV SOLN: 500.0000 mL | INTRAVENOUS | Status: DC

## 2014-03-03 NOTE — Op Note (Signed)
[place]  [address]. [place], [address]   COLONOSCOPY PROCEDURE REPORT  PATIENT: [name], [name]  MR#: [id number] BIRTHDATE: [birth year] , 48  yrs. old GENDER: male ENDOSCOPIST: Inda Castle, MD REFERRED VE:LFYBOFB Delora Fuel, M.D. PROCEDURE DATE:  03/03/2014 PROCEDURE:   Colonoscopy, diagnostic First Screening Colonoscopy - Avg.  risk and is 50 yrs.  old or older Yes.  Prior Negative Screening - Now for repeat screening. N/A  History of Adenoma - Now for follow-up colonoscopy & has been > or = to 3 yrs.  N/A  Polyps Removed Today? No.  Recommend repeat exam, <10 yrs? No. ASA CLASS:   Class I INDICATIONS:first colonoscopy. MEDICATIONS: Monitored anesthesia care, Propofol 200 mg IV, and lidocaine 40mg IV  DESCRIPTION OF PROCEDURE:   After the risks benefits and alternatives of the procedure were thoroughly explained, informed consent was obtained.  The digital rectal exam revealed no abnormalities of the rectum.   The LB [id number]  endoscope was introduced through the anus and advanced to the cecum, which was identified by both the appendix and ileocecal valve. No adverse events experienced.   The quality of the prep was excellent using Suprep  The instrument was then slowly withdrawn as the colon was fully examined.      COLON FINDINGS: A normal appearing cecum, ileocecal valve, and appendiceal orifice were identified.  The ascending, transverse, descending, sigmoid colon, and rectum appeared unremarkable. Retroflexed views revealed no abnormalities. The time to cecum=3 minutes 54 seconds.  Withdrawal time=7 minutes 46 seconds.  The scope was withdrawn and the procedure completed. COMPLICATIONS: There were no immediate complications.  ENDOSCOPIC IMPRESSION: Normal colonoscopy  RECOMMENDATIONS: Continue current colorectal screening recommendations for "routine risk" patients with a repeat colonoscopy in 10 years.  eSigned:  Inda Castle, MD 03/03/2014 10:25 AM   cc:

## 2014-03-03 NOTE — Patient Instructions (Signed)
Normal colon today! Repeat colonoscopy in 10 years. Resume current medications.  Call us with any questions or concerns. Thank you!  YOU HAD AN ENDOSCOPIC PROCEDURE TODAY AT [facility]: Refer to the procedure report that was given to you for any specific questions about what was found during the examination.  If the procedure report does not answer your questions, please call your gastroenterologist to clarify.  If you requested that your care partner not be given the details of your procedure findings, then the procedure report has been included in a sealed envelope for you to review at your convenience later.  YOU SHOULD EXPECT: Some feelings of bloating in the abdomen. Passage of more gas than usual.  Walking can help get rid of the air that was put into your GI tract during the procedure and reduce the bloating. If you had a lower endoscopy (such as a colonoscopy or flexible sigmoidoscopy) you may notice spotting of blood in your stool or on the toilet paper. If you underwent a bowel prep for your procedure, then you may not have a normal bowel movement for a few days.  DIET: Your first meal following the procedure should be a light meal and then it is ok to progress to your normal diet.  A half-sandwich or bowl of soup is an example of a good first meal.  Heavy or fried foods are harder to digest and may make you feel nauseous or bloated.  Likewise meals heavy in dairy and vegetables can cause extra gas to form and this can also increase the bloating.  Drink plenty of fluids but you should avoid alcoholic beverages for 24 hours.  ACTIVITY: Your care partner should take you home directly after the procedure.  You should plan to take it easy, moving slowly for the rest of the day.  You can resume normal activity the day after the procedure however you should NOT DRIVE or use heavy machinery for 24 hours (because of the sedation medicines used during the test).    SYMPTOMS TO REPORT  IMMEDIATELY: A gastroenterologist can be reached at any hour.  During normal business hours, 8:30 AM to 5:00 PM Monday through Friday, call [phone].  After hours and on weekends, please call the GI answering service at [phone] who will take a message and have the physician on call contact you.   Following lower endoscopy (colonoscopy or flexible sigmoidoscopy):  Excessive amounts of blood in the stool  Significant tenderness or worsening of abdominal pains  Swelling of the abdomen that is new, acute  Fever of 100F or higher  Following upper endoscopy (EGD)  Vomiting of blood or coffee ground material  New chest pain or pain under the shoulder blades  Painful or persistently difficult swallowing  New shortness of breath  Fever of 100F or higher  Black, tarry-looking stools  FOLLOW UP: If any biopsies were taken you will be contacted by phone or by letter within the next 1-3 weeks.  Call your gastroenterologist if you have not heard about the biopsies in 3 weeks.  Our staff will call the home number listed on your records the next business day following your procedure to check on you and address any questions or concerns that you may have at that time regarding the information given to you following your procedure. This is a courtesy call and so if there is no answer at the home number and we have not heard from you through the emergency physician  on call, we will assume that you have returned to your regular daily activities without incident.  SIGNATURES/CONFIDENTIALITY: You and/or your care partner have signed paperwork which will be entered into your electronic medical record.  These signatures attest to the fact that that the information above on your After Visit Summary has been reviewed and is understood.  Full responsibility of the confidentiality of this discharge information lies with you and/or your care-partner.

## 2014-03-03 NOTE — Progress Notes (Signed)
Stable to RR 

## 2014-03-04 ENCOUNTER — Telehealth: Payer: Self-pay | Admitting: *Deleted

## 2014-03-04 NOTE — Telephone Encounter (Signed)
  Follow up Call-  Call back number 03/03/2014  Post procedure Call Back phone  # [phone]  Permission to leave phone message Yes     Patient questions:  Do you have a fever, pain , or abdominal swelling? No. Pain Score  0 *  Have you tolerated food without any problems? Yes.    Have you been able to return to your normal activities? Yes.    Do you have any questions about your discharge instructions: Diet   No. Medications  No. Follow up visit  No.  Do you have questions or concerns about your Care? No.  Actions: * If pain score is 4 or above: No action needed, pain <4.

## 2014-03-30 ENCOUNTER — Other Ambulatory Visit (INDEPENDENT_AMBULATORY_CARE_PROVIDER_SITE_OTHER): Payer: BC Managed Care – PPO

## 2014-03-30 DIAGNOSIS — E7800 Pure hypercholesterolemia, unspecified: Secondary | ICD-10-CM

## 2014-03-30 LAB — LIPID PANEL
CHOLESTEROL: 226 mg/dL — AB (ref 0–200)
HDL: 38.1 mg/dL — ABNORMAL LOW (ref >39.00)
LDL CALC: 174 mg/dL — AB (ref 0–99)
NonHDL: 187.9
Total CHOL/HDL Ratio: 6
Triglycerides: 72 mg/dL (ref 0.0–149.0)
VLDL: 14.4 mg/dL (ref 0.0–40.0)

## 2014-04-07 ENCOUNTER — Other Ambulatory Visit: Payer: Self-pay | Admitting: Family Medicine

## 2014-04-07 DIAGNOSIS — E785 Hyperlipidemia, unspecified: Secondary | ICD-10-CM

## 2014-04-07 MED ORDER — SIMVASTATIN 20 MG PO TABS: 20.0000 mg | ORAL_TABLET | Freq: Every day | ORAL | Status: DC

## 2014-06-09 ENCOUNTER — Other Ambulatory Visit: Payer: Self-pay

## 2014-07-07 ENCOUNTER — Other Ambulatory Visit (INDEPENDENT_AMBULATORY_CARE_PROVIDER_SITE_OTHER): Payer: BLUE CROSS/BLUE SHIELD

## 2014-07-07 DIAGNOSIS — E785 Hyperlipidemia, unspecified: Secondary | ICD-10-CM

## 2014-07-07 LAB — HEPATIC FUNCTION PANEL
ALT: 32 U/L (ref 0–53)
AST: 30 U/L (ref 0–37)
Albumin: 4.4 g/dL (ref 3.5–5.2)
Alkaline Phosphatase: 61 U/L (ref 39–117)
Bilirubin, Direct: 0.1 mg/dL (ref 0.0–0.3)
Total Bilirubin: 0.5 mg/dL (ref 0.2–1.2)
Total Protein: 7 g/dL (ref 6.0–8.3)

## 2014-07-07 LAB — LIPID PANEL
Cholesterol: 170 mg/dL (ref 0–200)
HDL: 45.1 mg/dL (ref >39.00)
LDL Cholesterol: 110 mg/dL — ABNORMAL HIGH (ref 0–99)
NonHDL: 124.9
Total CHOL/HDL Ratio: 4
Triglycerides: 74 mg/dL (ref 0.0–149.0)
VLDL: 14.8 mg/dL (ref 0.0–40.0)

## 2014-08-04 ENCOUNTER — Telehealth: Payer: Self-pay | Admitting: Family Medicine

## 2014-08-04 NOTE — Telephone Encounter (Signed)
Lipids improved on Simvastatin.  Will need to follow up with Dr Sherren Mocha regarding follow up interval.

## 2014-08-04 NOTE — Telephone Encounter (Signed)
Pt needs chole results.

## 2014-08-05 NOTE — Telephone Encounter (Signed)
Called and spoke with pt and pt is aware.  

## 2014-10-11 ENCOUNTER — Telehealth: Payer: Self-pay | Admitting: Family Medicine

## 2014-10-11 MED ORDER — SIMVASTATIN 20 MG PO TABS: 20.0000 mg | ORAL_TABLET | Freq: Every day | ORAL | Status: DC

## 2014-10-11 NOTE — Telephone Encounter (Signed)
Pt request refill simvastatin (ZOCOR) 20 MG tablet  Pt never made the fup w/ dr todd. He states he is not aware that an appt was needed, (phone note 4/6) [facility] [address]

## 2014-10-27 ENCOUNTER — Ambulatory Visit: Payer: BLUE CROSS/BLUE SHIELD | Admitting: Family Medicine

## 2014-11-24 ENCOUNTER — Ambulatory Visit (INDEPENDENT_AMBULATORY_CARE_PROVIDER_SITE_OTHER): Payer: BLUE CROSS/BLUE SHIELD | Admitting: Family Medicine

## 2014-11-24 ENCOUNTER — Encounter: Payer: Self-pay | Admitting: Family Medicine

## 2014-11-24 VITALS — BP 144/80 | HR 56 | Temp 98.5°F | Resp 16 | Ht 69.0 in | Wt 204.0 lb

## 2014-11-24 DIAGNOSIS — I10 Essential (primary) hypertension: Secondary | ICD-10-CM

## 2014-11-24 DIAGNOSIS — E785 Hyperlipidemia, unspecified: Secondary | ICD-10-CM

## 2014-11-24 LAB — LIPID PANEL
Cholesterol: 175 mg/dL (ref 0–200)
HDL: 51.8 mg/dL (ref >39.00)
LDL Cholesterol: 107 mg/dL — ABNORMAL HIGH (ref 0–99)
NonHDL: 123.2
TRIGLYCERIDES: 83 mg/dL (ref 0.0–149.0)
Total CHOL/HDL Ratio: 3
VLDL: 16.6 mg/dL (ref 0.0–40.0)

## 2014-11-24 MED ORDER — LISINOPRIL 10 MG PO TABS: 10.0000 mg | ORAL_TABLET | Freq: Every day | ORAL | Status: DC

## 2014-11-24 NOTE — Progress Notes (Signed)
Office Note 11/24/2014  CC:  Chief Complaint  Patient presents with  . Establish Care    Pt is not fasting.    HPI:  [name] is a 49 y.o. White male who is here to transfer care and discuss HTN and hyperlipidemia. Patient's most recent primary MD: Dr. Sherren Mocha at [facility]. Old records were reviewed prior to or during today's visit.  Had a few high bp readings over the last few years at MD visits.  Then he would monitor at home and some would be high and some low.  Highest in 170s.  He thinks avg systolic has been around 160F.  Avg diast always <90.   Unclear if he has been on bp med in the past or not.  Hyperlipidemia: He has been rx'd statin in the past and his cholesterol did come down.  He is still taking it. He is skeptical about his last results, however, and asks for recheck of FLP today.    Past Medical History  Diagnosis Date  . Hyperlipemia   . Allergic rhinitis   . GERD (gastroesophageal reflux disease)     hx of esophagitis  . History of gastritis     Past Surgical History  Procedure Laterality Date  . Appendectomy  1990  . Wrist surgery Left 2009    reconstruction after fracture  . Colonoscopy  06/2000; 03/03/14    2002 diverticulosis.  2015 NORMAL: recall 10 yrs  . Esophagogastroduodenoscopy  2002; 08/31/10    2002 esophagitis and gastroduodenitis.  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    Family History  Problem Relation Age of Onset  . Leukemia Mother   . Diabetes Mother   . Breast cancer Mother   . Lung cancer Father   . Colon cancer Neg Hx   . Esophageal cancer Neg Hx   . Rectal cancer Neg Hx   . Stomach cancer Neg Hx   . Diabetes Maternal Uncle     History   Social History  . Marital Status: Married    Spouse Name: N/A  . Number of Children: 3  . Years of Education: N/A   Occupational History  . self employed    Social History Main Topics  . Smoking status: Never Smoker   .  Smokeless tobacco: Never Used  . Alcohol Use: 0.6 oz/week    1 Shots of liquor per week  . Drug Use: No  . Sexual Activity: Not on file   Other Topics Concern  . Not on file   Social History Narrative   Married, 3 children.   Owns 3 advertising businesses.   No T/A/Ds.   He is a self-described workaholic.   Does CV/wt's workout daily.    Outpatient Encounter Prescriptions as of 11/24/2014  Medication Sig  . aspirin 81 MG tablet Take 81 mg by mouth daily.    Marland Kitchen lisinopril (PRINIVIL,ZESTRIL) 10 MG tablet Take 1 tablet (10 mg total) by mouth daily.  . Multiple Vitamin (MULTIVITAMIN) tablet Take 1 tablet by mouth daily.  . simvastatin (ZOCOR) 20 MG tablet Take 1 tablet (20 mg total) by mouth at bedtime.   Facility-Administered Encounter Medications as of 11/24/2014  Medication  . 0.9 %  sodium chloride infusion    No Known Allergies  ROS Review of Systems  Constitutional: Negative for fever and fatigue.  HENT: Negative for congestion and sore throat.   Eyes: Negative for visual disturbance.  Respiratory: Negative for cough.   Cardiovascular: Negative  for chest pain.  Gastrointestinal: Negative for nausea and abdominal pain.  Genitourinary: Negative for dysuria.  Musculoskeletal: Negative for back pain and joint swelling.  Skin: Negative for rash.  Neurological: Negative for weakness and headaches.  Hematological: Negative for adenopathy.    PE; Blood pressure 144/80, pulse 56, temperature 98.5 F (36.9 C), temperature source Oral, resp. rate 16, height 5\' 9"  (1.753 m), weight 204 lb (92.534 kg), SpO2 95 %. Manual recheck bp 144/80 Gen: Alert, well appearing.  Patient is oriented to person, place, time, and situation. JOI:TGPQ: no injection, icteris, swelling, or exudate.  EOMI, PERRLA. Mouth: lips without lesion/swelling.  Oral mucosa pink and moist. Oropharynx without erythema, exudate, or swelling.  Neck - No masses or thyromegaly or limitation in range of motion CV:  RRR, no m/r/g.   LUNGS: CTA bilat, nonlabored resps, good aeration in all lung fields. EXT: no clubbing, cyanosis, or edema.   Pertinent labs:  Lab Results  Component Value Date   TSH 2.66 12/22/2013     Chemistry      Component Value Date/Time   NA 140 12/22/2013 0850   K 4.4 12/22/2013 0850   CL 104 12/22/2013 0850   CO2 29 12/22/2013 0850   BUN 12 12/22/2013 0850   CREATININE 0.9 12/22/2013 0850      Component Value Date/Time   CALCIUM 9.6 12/22/2013 0850   ALKPHOS 61 07/07/2014 0902   AST 30 07/07/2014 0902   ALT 32 07/07/2014 0902   BILITOT 0.5 07/07/2014 0902     Lab Results  Component Value Date   CHOL 170 07/07/2014   HDL 45.10 07/07/2014   LDLCALC 110* 07/07/2014   LDLDIRECT 199.7 02/27/2013   TRIG 74.0 07/07/2014   CHOLHDL 4 07/07/2014    ASSESSMENT AND PLAN:   Transfer pt;  1) HTN; needs to start med.  Lisinopril 10mg  qd rx'd today, recommended pt check bp at least 4 times a week and bring numbers in for review in 1 mo. Therapeutic expectations and side effect profile of medication discussed today.  Patient's questions answered.   2) Hyperlipidemia: tolerating statin fine, transaminases normal 06/2014. Recheck FLP today per pt's request.  An After Visit Summary was printed and given to the patient.   Return in about 1 month (around 12/25/2014) for f/u HTN.

## 2014-11-24 NOTE — Progress Notes (Signed)
Pre visit review using our clinic review tool, if applicable. No additional management support is needed unless otherwise documented below in the visit note. 

## 2014-12-22 ENCOUNTER — Ambulatory Visit (INDEPENDENT_AMBULATORY_CARE_PROVIDER_SITE_OTHER): Payer: BLUE CROSS/BLUE SHIELD | Admitting: Family Medicine

## 2014-12-22 ENCOUNTER — Encounter: Payer: Self-pay | Admitting: Family Medicine

## 2014-12-22 VITALS — BP 128/80 | HR 44 | Temp 98.0°F | Resp 16 | Ht 69.0 in | Wt 205.0 lb

## 2014-12-22 DIAGNOSIS — J209 Acute bronchitis, unspecified: Secondary | ICD-10-CM

## 2014-12-22 DIAGNOSIS — H9193 Unspecified hearing loss, bilateral: Secondary | ICD-10-CM | POA: Diagnosis not present

## 2014-12-22 DIAGNOSIS — J069 Acute upper respiratory infection, unspecified: Secondary | ICD-10-CM | POA: Diagnosis not present

## 2014-12-22 DIAGNOSIS — I10 Essential (primary) hypertension: Secondary | ICD-10-CM | POA: Diagnosis not present

## 2014-12-22 LAB — BASIC METABOLIC PANEL
BUN: 17 mg/dL (ref 6–23)
CHLORIDE: 103 meq/L (ref 96–112)
CO2: 26 meq/L (ref 19–32)
Calcium: 9.4 mg/dL (ref 8.4–10.5)
Creatinine, Ser: 0.93 mg/dL (ref 0.40–1.50)
GFR: 90.97 mL/min (ref >60.00)
Glucose, Bld: 107 mg/dL — ABNORMAL HIGH (ref 70–99)
Potassium: 4.5 mEq/L (ref 3.5–5.1)
SODIUM: 137 meq/L (ref 135–145)

## 2014-12-22 MED ORDER — HYDROCODONE-HOMATROPINE 5-1.5 MG/5ML PO SYRP: ORAL_SOLUTION | ORAL | Status: DC

## 2014-12-22 NOTE — Progress Notes (Signed)
Pre visit review using our clinic review tool, if applicable. No additional management support is needed unless otherwise documented below in the visit note. 

## 2014-12-22 NOTE — Patient Instructions (Signed)
Take mucinex DM as instructed on packaging--daytime for cough.  Take your prescription cough syrup for night-time cough.

## 2014-12-22 NOTE — Progress Notes (Signed)
OFFICE VISIT  12/22/2014   CC:  Chief Complaint  Patient presents with  . Follow-up    Pt is fasting.   HPI:    Patient is a 49 y.o. Caucasian male who presents for 1 mo f/u HTN. Started lisinopril 10mg  qd last visit.  Reviewed home bp's done over the last mo: syst avg 120, diast avg 65, no HR measurements.   HR on past measurements here have been 50-60. No signif side effects.  Has had 9d of cough, ST, nasal cong/runny nose, HA.  No fevers or wheezing or SOB. Cough drops and robitussin otc not helping much.   Past Medical History  Diagnosis Date  . Hyperlipemia   . Allergic rhinitis   . GERD (gastroesophageal reflux disease)     hx of esophagitis  . History of gastritis     Past Surgical History  Procedure Laterality Date  . Appendectomy  1990  . Wrist surgery Left 2009    reconstruction after fracture  . Colonoscopy  06/2000; 03/03/14    2002 diverticulosis.  2015 NORMAL: recall 10 yrs  . Esophagogastroduodenoscopy  2002; 08/31/10    2002 esophagitis and gastroduodenitis.  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    Outpatient Prescriptions Prior to Visit  Medication Sig Dispense Refill  . aspirin 81 MG tablet Take 81 mg by mouth daily.      Marland Kitchen lisinopril (PRINIVIL,ZESTRIL) 10 MG tablet Take 1 tablet (10 mg total) by mouth daily. 30 tablet 1  . Multiple Vitamin (MULTIVITAMIN) tablet Take 1 tablet by mouth daily.    . simvastatin (ZOCOR) 20 MG tablet Take 1 tablet (20 mg total) by mouth at bedtime. 90 tablet 0   Facility-Administered Medications Prior to Visit  Medication Dose Route Frequency Provider Last Rate Last Dose  . 0.9 %  sodium chloride infusion  500 mL Intravenous Continuous Inda Castle, MD        No Known Allergies  ROS As per HPI  PE: Blood pressure 128/80, pulse 44, temperature 98 F (36.7 C), temperature source Oral, resp. rate 16, height 5\' 9"  (1.753 m), weight 205 lb (92.987 kg), SpO2 95 %. VS:  noted--normal. Gen: alert, NAD, NONTOXIC APPEARING. HEENT: eyes without injection, drainage, or swelling.  Ears: EACs clear, TMs with normal light reflex and landmarks.  Nose: Clear rhinorrhea, with some dried, crusty exudate adherent to mildly injected mucosa.  No purulent d/c.  No paranasal sinus TTP.  No facial swelling.  Throat and mouth without focal lesion.  No pharyngial swelling, erythema, or exudate.   Neck: supple, no LAD.   LUNGS: CTA bilat, nonlabored resps.   CV: RRR, no m/r/g. EXT: no c/c/e SKIN: no rash  LABS:  Lab Results  Component Value Date   CHOL 175 11/24/2014   HDL 51.80 11/24/2014   LDLCALC 107* 11/24/2014   LDLDIRECT 199.7 02/27/2013   TRIG 83.0 11/24/2014   CHOLHDL 3 11/24/2014     Chemistry      Component Value Date/Time   NA 140 12/22/2013 0850   K 4.4 12/22/2013 0850   CL 104 12/22/2013 0850   CO2 29 12/22/2013 0850   BUN 12 12/22/2013 0850   CREATININE 0.9 12/22/2013 0850      Component Value Date/Time   CALCIUM 9.6 12/22/2013 0850   ALKPHOS 61 07/07/2014 0902   AST 30 07/07/2014 0902   ALT 32 07/07/2014 0902   BILITOT 0.5 07/07/2014 0902      IMPRESSION AND PLAN:  1)  HTN; well controlled on 10mg  lisinopril. The current medical regimen is effective;  continue present plan and medications. BMET today.  2) Viral URI with acute bronchitis, no sign of RAD. Mucinex DM daytime and hycodan syrup hs prn cough. Avoid OTC decongestants due to HTN.  3) Bilat hearing loss per pt report, progressing gradually over last few years. Refer to audiology for further evaluation and management.  An After Visit Summary was printed and given to the patient.  FOLLOW UP: Return in about 6 months (around 06/24/2015) for f/u HTN and hyperlipidemia.

## 2014-12-23 ENCOUNTER — Telehealth: Payer: Self-pay

## 2014-12-23 NOTE — Telephone Encounter (Signed)
Pls notify pt that I already ordered an audiology referral for him and he'll be getting contacted about arranging that appt.-thx

## 2014-12-23 NOTE — Telephone Encounter (Signed)
Call pt advised that Dr. Anitra Lauth has already made audiology referral and that they will contact him.

## 2014-12-23 NOTE — Telephone Encounter (Signed)
When I called with lab results, patient asked if we will call and make an appt. for exam for hearing loss or will we recommed someone and he should call?  Please advise.

## 2015-01-03 ENCOUNTER — Other Ambulatory Visit: Payer: Self-pay | Admitting: Family Medicine

## 2015-01-04 ENCOUNTER — Telehealth: Payer: Self-pay | Admitting: *Deleted

## 2015-01-04 MED ORDER — AZITHROMYCIN 250 MG PO TABS: ORAL_TABLET | ORAL | Status: DC

## 2015-01-04 MED ORDER — HYDROCODONE-HOMATROPINE 5-1.5 MG/5ML PO SYRP: ORAL_SOLUTION | ORAL | Status: DC

## 2015-01-04 NOTE — Telephone Encounter (Signed)
OK. Z-pack and hycodan rx's done. Need to see pt if not improved after this.-thx

## 2015-01-04 NOTE — Telephone Encounter (Signed)
Spoke to pt and he stated that he does not have any fever, chills or sweats. He stated that he does have produtive cough, nasal congestion and sore throat. Please advise. Thanks.

## 2015-01-04 NOTE — Telephone Encounter (Signed)
Left detailed message on cell vm, okay per DPR.  

## 2015-01-04 NOTE — Telephone Encounter (Signed)
Pt LMOM on 01/04/15 at 10:41am  He stated that he was in to see Dr. Anitra Lauth a few weeks ago and was given Rx for his cough. He stated that he still has the cough and is now starting to develop other cold symptoms. He is requesting another Rx for the cough syrup and an antibiotic sent to [facility]. Please advise. Thanks.

## 2015-01-05 ENCOUNTER — Other Ambulatory Visit: Payer: Self-pay | Admitting: *Deleted

## 2015-01-05 MED ORDER — LISINOPRIL 10 MG PO TABS: 10.0000 mg | ORAL_TABLET | Freq: Every day | ORAL | Status: DC

## 2015-01-05 NOTE — Telephone Encounter (Signed)
RF request for lisinopril LOV: 12/22/14 Next ov:  06/22/15 Last written: 11/24/14 #30 w/ 1RF

## 2015-02-01 ENCOUNTER — Other Ambulatory Visit: Payer: Self-pay | Admitting: Family Medicine

## 2015-02-01 MED ORDER — SIMVASTATIN 20 MG PO TABS: ORAL_TABLET | ORAL | Status: DC

## 2015-02-01 MED ORDER — LISINOPRIL 10 MG PO TABS: 10.0000 mg | ORAL_TABLET | Freq: Every day | ORAL | Status: DC

## 2015-06-09 ENCOUNTER — Telehealth: Payer: Self-pay | Admitting: Family Medicine

## 2015-06-09 MED ORDER — LISINOPRIL 10 MG PO TABS: 10.0000 mg | ORAL_TABLET | Freq: Every day | ORAL | Status: DC

## 2015-06-09 MED ORDER — SIMVASTATIN 20 MG PO TABS: ORAL_TABLET | ORAL | Status: DC

## 2015-06-09 NOTE — Telephone Encounter (Signed)
Patient walked into office today stating that he would like to get his medications simvastatin and lisinopril to be 90 day supply so he can keep them all on the same cycle.    I sent in 90 day RX for patient today with no refills.   Patient has a appointment 06/22/15.

## 2015-06-15 ENCOUNTER — Telehealth: Payer: Self-pay | Admitting: *Deleted

## 2015-06-15 NOTE — Telephone Encounter (Signed)
Pt LMOM on 06/15/15 at 9:47am stating that he has a head cold, cough with congestion, losing his voice, started yesterday. He has been taking otc Nyquil and Dayquil which is not helping. He is requesting an antibiotic [facility]. Advised pt that he may need ov for this he voiced understanding and requested message be sent back first. Please advise. Thanks.

## 2015-06-15 NOTE — Telephone Encounter (Signed)
Pt advised and voiced understanding.   

## 2015-06-15 NOTE — Telephone Encounter (Signed)
Sorry, it is not appropriate medical care to prescribe antibiotic to treat a head cold that has been present for 1 day. I recommend he continue to give otc meds a try (mucinex DM + saline nasal spray) for approximately a week and if not improving at that point in time then come in to office to be checked out so see if any additional meds are needed.-thx

## 2015-06-22 ENCOUNTER — Ambulatory Visit: Payer: BLUE CROSS/BLUE SHIELD | Admitting: Family Medicine

## 2015-06-29 ENCOUNTER — Encounter: Payer: Self-pay | Admitting: Family Medicine

## 2015-06-29 ENCOUNTER — Ambulatory Visit (INDEPENDENT_AMBULATORY_CARE_PROVIDER_SITE_OTHER): Payer: BLUE CROSS/BLUE SHIELD | Admitting: Family Medicine

## 2015-06-29 VITALS — BP 137/48 | HR 65 | Temp 97.8°F | Resp 16 | Ht 69.0 in | Wt 207.0 lb

## 2015-06-29 DIAGNOSIS — E785 Hyperlipidemia, unspecified: Secondary | ICD-10-CM

## 2015-06-29 DIAGNOSIS — H9193 Unspecified hearing loss, bilateral: Secondary | ICD-10-CM

## 2015-06-29 DIAGNOSIS — S56911A Strain of unspecified muscles, fascia and tendons at forearm level, right arm, initial encounter: Secondary | ICD-10-CM | POA: Diagnosis not present

## 2015-06-29 DIAGNOSIS — I10 Essential (primary) hypertension: Secondary | ICD-10-CM | POA: Insufficient documentation

## 2015-06-29 NOTE — Progress Notes (Signed)
OFFICE VISIT  06/29/2015   CC:  Chief Complaint  Patient presents with  . Follow-up   HPI:    Patient is a 50 y.o. Caucasian male who presents for 6 mo f/u HTN and hyperlipidemia. Feeling well. Not monitoring bp at home. Compliant with meds.  Says he jolted his R forearm and wrist when he broke his fall after tripping over a little fence 3 mo ago. Initially was hard to flex and extend elbow completely, after 6 wks of resting the arm he started going to a chiropracter and it has been gradually getting better.  He still feels pain in flexor aspect of right forearm when he flexes and extends at the elbow and wrist.  There was never any swelling or redness or bruising of R arm.  He says his experience at [facility] aud[facility] did confirm he has hearing loss but he says the experience was less than stellar and he would like a 2nd opinion from another audiology office.    Past Medical History  Diagnosis Date  . Hyperlipemia   . Allergic rhinitis   . GERD (gastroesophageal reflux disease)     hx of esophagitis  . History of gastritis   . Essential hypertension     Past Surgical History  Procedure Laterality Date  . Appendectomy  1990  . Wrist surgery Left 2009    reconstruction after fracture  . Colonoscopy  06/2000; 03/03/14    2002 diverticulosis.  2015 NORMAL: recall 10 yrs  . Esophagogastroduodenoscopy  2002; 08/31/10    2002 esophagitis and gastroduodenitis.  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    Outpatient Prescriptions Prior to Visit  Medication Sig Dispense Refill  . aspirin 81 MG tablet Take 81 mg by mouth daily.      Marland Kitchen lisinopril (PRINIVIL,ZESTRIL) 10 MG tablet Take 1 tablet (10 mg total) by mouth daily. 90 tablet 0  . Multiple Vitamin (MULTIVITAMIN) tablet Take 1 tablet by mouth daily.    . simvastatin (ZOCOR) 20 MG tablet TAKE 1 TABLET (20 MG TOTAL) BY MOUTH AT BEDTIME. 90 tablet 0  . azithromycin (ZITHROMAX) 250 MG tablet 2  tabs po qd x 1d, then 1 tab po qd x 4d 6 tablet 0  . HYDROcodone-homatropine (HYCODAN) 5-1.5 MG/5ML syrup 1-2 tsp po qhs prn cough 120 mL 0   Facility-Administered Medications Prior to Visit  Medication Dose Route Frequency Provider Last Rate Last Dose  . 0.9 %  sodium chloride infusion  500 mL Intravenous Continuous Inda Castle, MD        No Known Allergies  ROS As per HPI  PE: Blood pressure 137/48, pulse 65, temperature 97.8 F (36.6 C), temperature source Oral, resp. rate 16, height 5\' 9"  (1.753 m), weight 207 lb (93.895 kg), SpO2 95 %. Gen: Alert, well appearing.  Patient is oriented to person, place, time, and situation. CV: RRR, no m/r/g.   LUNGS: CTA bilat, nonlabored resps, good aeration in all lung fields. R arm: no elbow erythema, tenderness, or warmth.  He can nearly fully extend the R arm at the elbow, and he can fully flex it at elbow.  Mild TTP in flexor mm group of R forearm, with pain elicited/reproduced when he supinates and flexes against my resistance.  Wrist nontender, with full ROM.  LABS:  Lab Results  Component Value Date   TSH 2.66 12/22/2013   Lab Results  Component Value Date   WBC 7.7 12/22/2013   HGB 16.3 12/22/2013  HCT 48.0 12/22/2013   MCV 91.7 12/22/2013   PLT 264.0 12/22/2013   Lab Results  Component Value Date   CREATININE 0.93 12/22/2014   BUN 17 12/22/2014   NA 137 12/22/2014   K 4.5 12/22/2014   CL 103 12/22/2014   CO2 26 12/22/2014   Lab Results  Component Value Date   ALT 32 07/07/2014   AST 30 07/07/2014   ALKPHOS 61 07/07/2014   BILITOT 0.5 07/07/2014   Lab Results  Component Value Date   CHOL 175 11/24/2014   Lab Results  Component Value Date   HDL 51.80 11/24/2014   Lab Results  Component Value Date   LDLCALC 107* 11/24/2014   Lab Results  Component Value Date   TRIG 83.0 11/24/2014   Lab Results  Component Value Date   CHOLHDL 3 11/24/2014   Lab Results  Component Value Date   PSA 2.77 12/22/2013     IMPRESSION AND PLAN:  1) HTN; The current medical regimen is effective;  continue present plan and medications. Lytes/cr today.  2) Hyperlipidemia: tolerating statin.  Will repeat FLP and AST/ALT at CPE in 6 mo.  3) Hearing loss: refer to [facility] in [place] for 2nd opinion.  4) R arm traumatic flexor mm strain: fitted pt with tennis/golf elbow strap from office today, recommended he wear this 4 hours on, 1 hour off while awake.  An After Visit Summary was printed and given to the patient.   FOLLOW UP: Return in about 6 months (around 12/30/2015) for annual CPE (fasting).  Signed:  Crissie Sickles, MD           06/29/2015

## 2015-07-29 ENCOUNTER — Telehealth: Payer: Self-pay

## 2015-07-29 NOTE — Telephone Encounter (Signed)
Left vmail to find out the status of the flu vaccine.

## 2015-09-06 ENCOUNTER — Other Ambulatory Visit: Payer: Self-pay

## 2015-09-06 MED ORDER — LISINOPRIL 10 MG PO TABS: 10.0000 mg | ORAL_TABLET | Freq: Every day | ORAL | Status: DC

## 2015-12-29 ENCOUNTER — Encounter: Payer: Self-pay | Admitting: Family Medicine

## 2015-12-29 ENCOUNTER — Ambulatory Visit (INDEPENDENT_AMBULATORY_CARE_PROVIDER_SITE_OTHER): Payer: BLUE CROSS/BLUE SHIELD | Admitting: Family Medicine

## 2015-12-29 VITALS — BP 140/88 | HR 56 | Temp 97.9°F | Resp 16 | Ht 69.0 in | Wt 210.0 lb

## 2015-12-29 DIAGNOSIS — Z0000 Encounter for general adult medical examination without abnormal findings: Secondary | ICD-10-CM | POA: Diagnosis not present

## 2015-12-29 DIAGNOSIS — Z125 Encounter for screening for malignant neoplasm of prostate: Secondary | ICD-10-CM | POA: Diagnosis not present

## 2015-12-29 LAB — CBC WITH DIFFERENTIAL/PLATELET
Basophils Absolute: 0 10*3/uL (ref 0.0–0.1)
Basophils Relative: 0.4 % (ref 0.0–3.0)
EOS PCT: 0.9 % (ref 0.0–5.0)
Eosinophils Absolute: 0.1 10*3/uL (ref 0.0–0.7)
HCT: 48.4 % (ref 39.0–52.0)
HEMOGLOBIN: 16.7 g/dL (ref 13.0–17.0)
LYMPHS ABS: 2.5 10*3/uL (ref 0.7–4.0)
Lymphocytes Relative: 28.7 % (ref 12.0–46.0)
MCHC: 34.5 g/dL (ref 30.0–36.0)
MCV: 89.6 fl (ref 78.0–100.0)
MONOS PCT: 9.6 % (ref 3.0–12.0)
Monocytes Absolute: 0.8 10*3/uL (ref 0.1–1.0)
NEUTROS PCT: 60.4 % (ref 43.0–77.0)
Neutro Abs: 5.2 10*3/uL (ref 1.4–7.7)
Platelets: 276 10*3/uL (ref 150.0–400.0)
RBC: 5.4 Mil/uL (ref 4.22–5.81)
RDW: 12.9 % (ref 11.5–15.5)
WBC: 8.6 10*3/uL (ref 4.0–10.5)

## 2015-12-29 LAB — COMPREHENSIVE METABOLIC PANEL
ALT: 40 U/L (ref 0–53)
AST: 30 U/L (ref 0–37)
Albumin: 4.8 g/dL (ref 3.5–5.2)
Alkaline Phosphatase: 54 U/L (ref 39–117)
BUN: 12 mg/dL (ref 6–23)
CHLORIDE: 103 meq/L (ref 96–112)
CO2: 30 meq/L (ref 19–32)
Calcium: 9.6 mg/dL (ref 8.4–10.5)
Creatinine, Ser: 0.95 mg/dL (ref 0.40–1.50)
GFR: 88.41 mL/min (ref >60.00)
GLUCOSE: 95 mg/dL (ref 70–99)
POTASSIUM: 4.9 meq/L (ref 3.5–5.1)
Sodium: 138 mEq/L (ref 135–145)
Total Bilirubin: 0.9 mg/dL (ref 0.2–1.2)
Total Protein: 7.2 g/dL (ref 6.0–8.3)

## 2015-12-29 LAB — LIPID PANEL
CHOL/HDL RATIO: 4
Cholesterol: 199 mg/dL (ref 0–200)
HDL: 50 mg/dL (ref >39.00)
LDL Cholesterol: 124 mg/dL — ABNORMAL HIGH (ref 0–99)
NONHDL: 148.84
Triglycerides: 123 mg/dL (ref 0.0–149.0)
VLDL: 24.6 mg/dL (ref 0.0–40.0)

## 2015-12-29 LAB — TSH: TSH: 2.03 u[IU]/mL (ref 0.35–4.50)

## 2015-12-29 LAB — PSA: PSA: 3.12 ng/mL (ref 0.10–4.00)

## 2015-12-29 NOTE — Patient Instructions (Signed)
Check your blood pressure and heart rate a couple times per month minimum. Normal BP is <140 over <90.  Normal heart rate is 50-100.

## 2015-12-29 NOTE — Progress Notes (Signed)
Office Note 12/29/2015  CC:  Chief Complaint  Patient presents with  . Annual Exam    HPI:  [name] is a 50 y.o. White male who is here for annual health maintenance exam. Feels better since getting on antihypertensive. He monitored bp after getting on med initially but when it was repeatedly normal he stopped monitoring it.  He is UTD on eye exams. Dental visits biannually.  Exercising regularly, taking care of himself more in the last year or so.    Past Medical History:  Diagnosis Date  . Allergic rhinitis   . Essential hypertension   . GERD (gastroesophageal reflux disease)    hx of esophagitis  . History of gastritis   . Hyperlipemia     Past Surgical History:  Procedure Laterality Date  . APPENDECTOMY  1990  . COLONOSCOPY  06/2000; 03/03/14   2002 diverticulosis.  2015 NORMAL: recall 10 yrs  . ESOPHAGOGASTRODUODENOSCOPY  2002; 08/31/10   2002 esophagitis and gastroduodenitis.  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  . WRIST SURGERY Left 2009   reconstruction after fracture    Family History  Problem Relation Age of Onset  . Leukemia Mother   . Diabetes Mother   . Breast cancer Mother   . Lung cancer Father   . Diabetes Maternal Uncle   . Colon cancer Neg Hx   . Esophageal cancer Neg Hx   . Rectal cancer Neg Hx   . Stomach cancer Neg Hx     Social History   Social History  . Marital status: Divorced    Spouse name: N/A  . Number of children: 3  . Years of education: N/A   Occupational History  . self employed    Social History Main Topics  . Smoking status: Never Smoker  . Smokeless tobacco: Never Used  . Alcohol use 0.6 oz/week    1 Shots of liquor per week  . Drug use: No  . Sexual activity: Yes   Other Topics Concern  . Not on file   Social History Narrative   Married, 3 children.   HS education.   Owns 3 advertising businesses.   No T/A/Ds.   He is a self-described workaholic.   Does CV/wt's workouts daily.    Outpatient Medications Prior to Visit  Medication Sig Dispense Refill  . aspirin 81 MG tablet Take 81 mg by mouth daily.      Marland Kitchen lisinopril (PRINIVIL,ZESTRIL) 10 MG tablet Take 1 tablet (10 mg total) by mouth daily. 90 tablet 2  . Multiple Vitamin (MULTIVITAMIN) tablet Take 1 tablet by mouth daily.    . simvastatin (ZOCOR) 20 MG tablet TAKE 1 TABLET (20 MG TOTAL) BY MOUTH AT BEDTIME. 90 tablet 0   Facility-Administered Medications Prior to Visit  Medication Dose Route Frequency Provider Last Rate Last Dose  . 0.9 %  sodium chloride infusion  500 mL Intravenous Continuous Inda Castle, MD        No Known Allergies  ROS Review of Systems  Constitutional: Negative for appetite change, chills, fatigue and fever.  HENT: Negative for congestion, dental problem, ear pain and sore throat.   Eyes: Negative for discharge, redness and visual disturbance.  Respiratory: Negative for cough, chest tightness, shortness of breath and wheezing.   Cardiovascular: Negative for chest pain, palpitations and leg swelling.  Gastrointestinal: Negative for abdominal pain, blood in stool, diarrhea, nausea and vomiting.  Genitourinary: Negative for difficulty urinating, dysuria, flank pain, frequency, hematuria and  urgency.  Musculoskeletal: Negative for arthralgias, back pain, joint swelling, myalgias and neck stiffness.  Skin: Negative for pallor and rash.  Neurological: Negative for dizziness, speech difficulty, weakness and headaches.  Hematological: Negative for adenopathy. Does not bruise/bleed easily.  Psychiatric/Behavioral: Negative for confusion and sleep disturbance. The patient is not nervous/anxious.     PE; Blood pressure (!) 157/77, pulse (!) 56, temperature 97.9 F (36.6 C), resp. rate 16, height 5\' 9"  (1.753 m), weight 210 lb (95.3 kg), SpO2 96 %. Gen: Alert, well appearing.  Patient is oriented to person, place, time, and situation. AFFECT: pleasant, lucid  thought and speech. ENT: Ears: EACs clear, normal epithelium.  TMs with good light reflex and landmarks bilaterally.  Eyes: no injection, icteris, swelling, or exudate.  EOMI, PERRLA. Nose: no drainage or turbinate edema/swelling.  No injection or focal lesion.  Mouth: lips without lesion/swelling.  Oral mucosa pink and moist.  Dentition intact and without obvious caries or gingival swelling.  Oropharynx without erythema, exudate, or swelling.  Neck: supple/nontender.  No LAD, mass, or TM.  Carotid pulses 2+ bilaterally, without bruits. CV: RRR, no m/r/g.   LUNGS: CTA bilat, nonlabored resps, good aeration in all lung fields. ABD: soft, NT, ND, BS normal.  No hepatospenomegaly or mass.  No bruits. EXT: no clubbing, cyanosis, or edema.  Musculoskeletal: no joint swelling, erythema, warmth, or tenderness.  ROM of all joints intact. Skin - no sores or suspicious lesions or rashes or color changes Rectal exam: negative without mass, lesions or tenderness, PROSTATE EXAM: smooth and symmetric without nodules or tenderness.  Pertinent labs:  Lab Results  Component Value Date   TSH 2.66 12/22/2013   Lab Results  Component Value Date   WBC 7.7 12/22/2013   HGB 16.3 12/22/2013   HCT 48.0 12/22/2013   MCV 91.7 12/22/2013   PLT 264.0 12/22/2013   Lab Results  Component Value Date   CREATININE 0.93 12/22/2014   BUN 17 12/22/2014   NA 137 12/22/2014   K 4.5 12/22/2014   CL 103 12/22/2014   CO2 26 12/22/2014   Lab Results  Component Value Date   ALT 32 07/07/2014   AST 30 07/07/2014   ALKPHOS 61 07/07/2014   BILITOT 0.5 07/07/2014   Lab Results  Component Value Date   CHOL 175 11/24/2014   Lab Results  Component Value Date   HDL 51.80 11/24/2014   Lab Results  Component Value Date   LDLCALC 107 (H) 11/24/2014   Lab Results  Component Value Date   TRIG 83.0 11/24/2014   Lab Results  Component Value Date   CHOLHDL 3 11/24/2014   Lab Results  Component Value Date   PSA  2.77 12/22/2013    ASSESSMENT AND PLAN:   Health maintenance exam:  Reviewed age and gender appropriate health maintenance issues (prudent diet, regular exercise, health risks of tobacco and excessive alcohol, use of seatbelts, fire alarms in home, use of sunscreen).  Also reviewed age and gender appropriate health screening as well as vaccine recommendations. No vaccines due today. Fasting HP labs drawn. Prostate ca screening: DRE normal, PSA drawn today. Colon ca screening: normal colonoscopy 2015, next one due 2025. HTN is adequately controlled but I asked him to keep an eye on it at home better.  Instructions: Check your blood pressure and heart rate a couple times per month minimum. Normal BP is <140 over <90.  Normal heart rate is 50-100. Call or return if persistently abnormal.  An After Visit Summary  was printed and given to the patient.  FOLLOW UP:  No Follow-up on file.  Signed:  Crissie Sickles, MD           12/29/2015

## 2016-01-04 ENCOUNTER — Encounter: Payer: BLUE CROSS/BLUE SHIELD | Admitting: Family Medicine

## 2016-03-16 ENCOUNTER — Other Ambulatory Visit: Payer: Self-pay

## 2016-03-16 MED ORDER — SIMVASTATIN 20 MG PO TABS: ORAL_TABLET | ORAL | 0 refills | Status: DC

## 2016-03-16 NOTE — Telephone Encounter (Signed)
refill sent 

## 2016-06-13 ENCOUNTER — Other Ambulatory Visit: Payer: Self-pay | Admitting: *Deleted

## 2016-06-13 MED ORDER — LISINOPRIL 10 MG PO TABS: 10.0000 mg | ORAL_TABLET | Freq: Every day | ORAL | 1 refills | Status: DC

## 2016-06-13 NOTE — Telephone Encounter (Signed)
Fax from [facility].  RF request for lisinopril LOV: 12/29/15 Next ov: 06/26/16 Last written: 09/06/15 #90 w/ 2RF

## 2016-06-14 ENCOUNTER — Other Ambulatory Visit: Payer: Self-pay | Admitting: *Deleted

## 2016-06-14 MED ORDER — SIMVASTATIN 20 MG PO TABS: ORAL_TABLET | ORAL | 1 refills | Status: DC

## 2016-06-14 NOTE — Telephone Encounter (Signed)
Fax from [facility].  RF request for simvastatin LOV: 12/29/15 Next ov: 06/26/16 Last written: 03/16/16 #90 w/ 0RF

## 2016-06-18 ENCOUNTER — Telehealth: Payer: Self-pay | Admitting: Family Medicine

## 2016-06-18 NOTE — Telephone Encounter (Signed)
Patient's girlfriend tested positive for Flu 06/13/16 and he wanted to know if he could get tamiflu to be proactive?  Patient doesn't have any symptoms as of yet.  Please advise.

## 2016-06-18 NOTE — Telephone Encounter (Signed)
Patient aware.

## 2016-06-18 NOTE — Telephone Encounter (Signed)
It is too late for prophylactic tamiflu to be of any help to him.  Sorry-thx

## 2016-06-19 ENCOUNTER — Telehealth: Payer: Self-pay | Admitting: Family Medicine

## 2016-06-19 NOTE — Telephone Encounter (Signed)
Left voicemail for pt in regards to switching providers per Dr. Sherren Mocha he would recommend pt and son to switch to [facility] and to set up appointment to establish care if they would like since Dr. Sherren Mocha is going into retirement

## 2016-06-19 NOTE — Telephone Encounter (Signed)
Pt would like to see Dr. Sherren Mocha again along with his son he no longer would like to see Dr. Anitra Lauth and if Dr. Sherren Mocha is not wilingl to see them he would like to know if he will refer him to someone other than Dr. Anitra Lauth he does not care to see him any longer.

## 2016-06-26 ENCOUNTER — Ambulatory Visit (INDEPENDENT_AMBULATORY_CARE_PROVIDER_SITE_OTHER): Payer: BLUE CROSS/BLUE SHIELD | Admitting: Family Medicine

## 2016-06-26 ENCOUNTER — Encounter: Payer: Self-pay | Admitting: Family Medicine

## 2016-06-26 VITALS — BP 126/75 | HR 45 | Temp 97.9°F | Resp 16 | Ht 69.0 in | Wt 209.5 lb

## 2016-06-26 DIAGNOSIS — R6889 Other general symptoms and signs: Secondary | ICD-10-CM | POA: Diagnosis not present

## 2016-06-26 DIAGNOSIS — E7800 Pure hypercholesterolemia, unspecified: Secondary | ICD-10-CM

## 2016-06-26 LAB — POC INFLUENZA A&B (BINAX/QUICKVUE)
INFLUENZA A, POC: NEGATIVE
INFLUENZA B, POC: NEGATIVE

## 2016-06-26 NOTE — Progress Notes (Signed)
Pre visit review using our clinic review tool, if applicable. No additional management support is needed unless otherwise documented below in the visit note. 

## 2016-06-26 NOTE — Progress Notes (Signed)
OFFICE VISIT  06/26/2016   CC:  Chief Complaint  Patient presents with  . Follow-up    RCI, pt not fasting.    HPI:    Patient is a 51 y.o. Caucasian male who presents for 6 mo f/u HTN, Hyperlipidemia, GERD with hx of esophagitis and hx of gastritis.  HLD: takes simva daily.  Possible legs aches as side effect. HTN: bp this morning 134/56.  No other bp checks.  Had HA this morning and felt fatigued.  Drank a lot of water and feels a little better.  No cough, no fever, no ST.  Has had quite a bit of achiness of legs the last month.  Works out regularly.  GER: no problems lately except with certain types of food.  No regular dosing of any OTC PPI or pepcid. He does say he takes pepcid once a week and this is helpful.  Past Medical History:  Diagnosis Date  . Allergic rhinitis   . Essential hypertension   . GERD (gastroesophageal reflux disease)    hx of esophagitis  . History of gastritis   . Hyperlipemia     Past Surgical History:  Procedure Laterality Date  . APPENDECTOMY  1990  . COLONOSCOPY  06/2000; 03/03/14   2002 diverticulosis.  2015 NORMAL: recall 10 yrs  . ESOPHAGOGASTRODUODENOSCOPY  2002; 08/31/10   2002 esophagitis and gastroduodenitis.  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  . WRIST SURGERY Left 2009   reconstruction after fracture    Outpatient Medications Prior to Visit  Medication Sig Dispense Refill  . aspirin 81 MG tablet Take 81 mg by mouth daily.      Marland Kitchen lisinopril (PRINIVIL,ZESTRIL) 10 MG tablet Take 1 tablet (10 mg total) by mouth daily. 90 tablet 1  . Multiple Vitamin (MULTIVITAMIN) tablet Take 1 tablet by mouth daily.    . simvastatin (ZOCOR) 20 MG tablet TAKE 1 TABLET (20 MG TOTAL) BY MOUTH AT BEDTIME. 90 tablet 1   Facility-Administered Medications Prior to Visit  Medication Dose Route Frequency Provider Last Rate Last Dose  . 0.9 %  sodium chloride infusion  500 mL Intravenous Continuous Inda Castle,  MD        No Known Allergies  ROS As per HPI  PE: Blood pressure 126/75, pulse (!) 45, temperature 97.9 F (36.6 C), temperature source Oral, resp. rate 16, height 5\' 9"  (1.753 m), weight 209 lb 8 oz (95 kg), SpO2 99 %. Gen: Alert, well appearing.  Patient is oriented to person, place, time, and situation. AFFECT: pleasant, lucid thought and speech. [id number]: no injection, icteris, swelling, or exudate.  EOMI, PERRLA. Mouth: lips without lesion/swelling.  Oral mucosa pink and moist. Oropharynx without erythema, exudate, or swelling.  Neck - No masses or thyromegaly or limitation in range of motion CV: RRR, no m/r/g.   LUNGS: CTA bilat, nonlabored resps, good aeration in all lung fields. EXT: no clubbing, cyanosis, or edema.   LABS:  Lab Results  Component Value Date   TSH 2.03 12/29/2015   Lab Results  Component Value Date   WBC 8.6 12/29/2015   HGB 16.7 12/29/2015   HCT 48.4 12/29/2015   MCV 89.6 12/29/2015   PLT 276.0 12/29/2015   Lab Results  Component Value Date   CREATININE 0.95 12/29/2015   BUN 12 12/29/2015   NA 138 12/29/2015   K 4.9 12/29/2015   CL 103 12/29/2015   CO2 30 12/29/2015   Lab Results  Component Value Date  ALT 40 12/29/2015   AST 30 12/29/2015   ALKPHOS 54 12/29/2015   BILITOT 0.9 12/29/2015   Lab Results  Component Value Date   CHOL 199 12/29/2015   Lab Results  Component Value Date   HDL 50.00 12/29/2015   Lab Results  Component Value Date   LDLCALC 124 (H) 12/29/2015   Lab Results  Component Value Date   TRIG 123.0 12/29/2015   Lab Results  Component Value Date   CHOLHDL 4 12/29/2015   Lab Results  Component Value Date   PSA 3.12 12/29/2015   PSA 2.77 12/22/2013   Rapid flu testing today: NEG  IMPRESSION AND PLAN:  1) HTN; The current medical regimen is effective;  continue present plan and medications. Lytes/cr great 6 mo ago.  Will recheck this at next f/u in 6 mo.  2) HLD: question of statin induced myalgias  in legs.  Instructions: Stop simvastatin for 2 weeks and see if your leg aching goes away.  If it does go away, then restart the zocor and see if the aching returns.  If it does, then call our office to report this and I will change you to a different cholesterol medication.  3) Hx of GERD with esophagitis: this is MUCH improved, and he is doing well with dietary modification and rare prn use of H2 blocker.  4) Headache, fatigue: onset today.  Exposure to flu (wife) recently. Flu test today NEG.  Watchful waiting approach at this time.  An After Visit Summary was printed and given to the patient.  FOLLOW UP: Return in about 6 months (around 12/24/2016) for annual CPE (fasting).  Signed:  Crissie Sickles, MD           06/26/2016

## 2016-06-26 NOTE — Addendum Note (Signed)
Addended by: Onalee Hua on: 06/26/2016 12:03 PM   Modules accepted: Orders

## 2016-06-26 NOTE — Patient Instructions (Signed)
Stop simvastatin for 2 weeks and see if your leg aching goes away.  If it does go away, then restart the zocor and see if the aching returns.  If it does, then call our office to report this and I will change you to a different cholesterol medication.

## 2016-08-27 ENCOUNTER — Telehealth: Payer: Self-pay | Admitting: Family Medicine

## 2016-08-27 DIAGNOSIS — E785 Hyperlipidemia, unspecified: Secondary | ICD-10-CM

## 2016-08-27 MED ORDER — PRAVASTATIN SODIUM 40 MG PO TABS: 40.0000 mg | ORAL_TABLET | Freq: Every day | ORAL | 3 refills | Status: DC

## 2016-08-27 NOTE — Telephone Encounter (Signed)
Left detailed message on home vm, okay per DPR.  

## 2016-08-27 NOTE — Telephone Encounter (Signed)
Stop simvastatin. I sent in rx for pravastatin to try. If tolerates this med daily then he should make lab appointment for fasting lipid panel in 2-3 months, dx hypercholesterolemia.-thx

## 2016-08-27 NOTE — Telephone Encounter (Signed)
Patient states the aching is gone after discontinuing simvastatin. When he restarted medication the pain returned.

## 2016-08-27 NOTE — Telephone Encounter (Signed)
Please advise. Thanks.  

## 2016-10-23 ENCOUNTER — Other Ambulatory Visit: Payer: Self-pay | Admitting: *Deleted

## 2016-10-23 MED ORDER — PRAVASTATIN SODIUM 40 MG PO TABS: 40.0000 mg | ORAL_TABLET | Freq: Every day | ORAL | 1 refills | Status: DC

## 2016-10-23 NOTE — Telephone Encounter (Signed)
[name], requesting 90 day supply.  RF request for pravastatin LOV: 06/26/16 Next ov: 01/02/17 Last written: 08/27/16 #30 w/ 3RF

## 2016-12-10 ENCOUNTER — Other Ambulatory Visit: Payer: Self-pay | Admitting: Family Medicine

## 2016-12-10 NOTE — Telephone Encounter (Signed)
[facility] 

## 2016-12-14 ENCOUNTER — Ambulatory Visit
Admission: RE | Admit: 2016-12-14 | Discharge: 2016-12-14 | Disposition: A | Discharge status: Home / Self Care | Payer: BLUE CROSS/BLUE SHIELD | Source: Ambulatory Visit | Attending: Chiropractic Medicine | Admitting: Chiropractic Medicine

## 2016-12-14 ENCOUNTER — Other Ambulatory Visit: Payer: Self-pay | Admitting: Chiropractic Medicine

## 2016-12-14 DIAGNOSIS — M79671 Pain in right foot: Secondary | ICD-10-CM

## 2016-12-14 DIAGNOSIS — M9904 Segmental and somatic dysfunction of sacral region: Secondary | ICD-10-CM | POA: Diagnosis not present

## 2016-12-14 DIAGNOSIS — M19071 Primary osteoarthritis, right ankle and foot: Secondary | ICD-10-CM | POA: Diagnosis not present

## 2016-12-14 DIAGNOSIS — M9903 Segmental and somatic dysfunction of lumbar region: Secondary | ICD-10-CM | POA: Diagnosis not present

## 2016-12-14 DIAGNOSIS — M9906 Segmental and somatic dysfunction of lower extremity: Secondary | ICD-10-CM | POA: Diagnosis not present

## 2016-12-14 DIAGNOSIS — M25474 Effusion, right foot: Secondary | ICD-10-CM | POA: Diagnosis not present

## 2016-12-19 DIAGNOSIS — M9906 Segmental and somatic dysfunction of lower extremity: Secondary | ICD-10-CM | POA: Diagnosis not present

## 2016-12-19 DIAGNOSIS — M9904 Segmental and somatic dysfunction of sacral region: Secondary | ICD-10-CM | POA: Diagnosis not present

## 2016-12-19 DIAGNOSIS — M25474 Effusion, right foot: Secondary | ICD-10-CM | POA: Diagnosis not present

## 2016-12-19 DIAGNOSIS — M9903 Segmental and somatic dysfunction of lumbar region: Secondary | ICD-10-CM | POA: Diagnosis not present

## 2017-01-02 ENCOUNTER — Other Ambulatory Visit: Payer: Self-pay | Admitting: Family Medicine

## 2017-01-02 ENCOUNTER — Ambulatory Visit (INDEPENDENT_AMBULATORY_CARE_PROVIDER_SITE_OTHER): Payer: BLUE CROSS/BLUE SHIELD | Admitting: Family Medicine

## 2017-01-02 ENCOUNTER — Encounter: Payer: Self-pay | Admitting: Family Medicine

## 2017-01-02 ENCOUNTER — Telehealth: Payer: Self-pay | Admitting: Family Medicine

## 2017-01-02 VITALS — BP 127/77 | HR 55 | Temp 98.0°F | Resp 16 | Ht 70.0 in | Wt 207.0 lb

## 2017-01-02 DIAGNOSIS — Z1159 Encounter for screening for other viral diseases: Secondary | ICD-10-CM | POA: Diagnosis not present

## 2017-01-02 DIAGNOSIS — T887XXA Unspecified adverse effect of drug or medicament, initial encounter: Secondary | ICD-10-CM | POA: Diagnosis not present

## 2017-01-02 DIAGNOSIS — Z9189 Other specified personal risk factors, not elsewhere classified: Secondary | ICD-10-CM | POA: Diagnosis not present

## 2017-01-02 DIAGNOSIS — Z0000 Encounter for general adult medical examination without abnormal findings: Secondary | ICD-10-CM | POA: Diagnosis not present

## 2017-01-02 DIAGNOSIS — Z125 Encounter for screening for malignant neoplasm of prostate: Secondary | ICD-10-CM

## 2017-01-02 DIAGNOSIS — E7800 Pure hypercholesterolemia, unspecified: Secondary | ICD-10-CM | POA: Diagnosis not present

## 2017-01-02 DIAGNOSIS — H918X3 Other specified hearing loss, bilateral: Secondary | ICD-10-CM | POA: Diagnosis not present

## 2017-01-02 DIAGNOSIS — M79671 Pain in right foot: Secondary | ICD-10-CM | POA: Diagnosis not present

## 2017-01-02 LAB — COMPREHENSIVE METABOLIC PANEL
ALBUMIN: 4.9 g/dL (ref 3.5–5.2)
ALK PHOS: 62 U/L (ref 39–117)
ALT: 33 U/L (ref 0–53)
AST: 29 U/L (ref 0–37)
BUN: 12 mg/dL (ref 6–23)
CALCIUM: 10.4 mg/dL (ref 8.4–10.5)
CO2: 28 mEq/L (ref 19–32)
Chloride: 102 mEq/L (ref 96–112)
Creatinine, Ser: 0.95 mg/dL (ref 0.40–1.50)
GFR: 88.07 mL/min (ref >60.00)
Glucose, Bld: 110 mg/dL — ABNORMAL HIGH (ref 70–99)
Potassium: 4.9 mEq/L (ref 3.5–5.1)
Sodium: 139 mEq/L (ref 135–145)
TOTAL PROTEIN: 7.2 g/dL (ref 6.0–8.3)
Total Bilirubin: 1 mg/dL (ref 0.2–1.2)

## 2017-01-02 LAB — CBC WITH DIFFERENTIAL/PLATELET
BASOS PCT: 0.2 % (ref 0.0–3.0)
Basophils Absolute: 0 10*3/uL (ref 0.0–0.1)
EOS ABS: 0.1 10*3/uL (ref 0.0–0.7)
Eosinophils Relative: 1.2 % (ref 0.0–5.0)
HEMATOCRIT: 50.6 % (ref 39.0–52.0)
Hemoglobin: 16.9 g/dL (ref 13.0–17.0)
Lymphocytes Relative: 28.4 % (ref 12.0–46.0)
Lymphs Abs: 2.1 10*3/uL (ref 0.7–4.0)
MCHC: 33.5 g/dL (ref 30.0–36.0)
MCV: 93.1 fl (ref 78.0–100.0)
Monocytes Absolute: 0.8 10*3/uL (ref 0.1–1.0)
Monocytes Relative: 10.1 % (ref 3.0–12.0)
NEUTROS ABS: 4.5 10*3/uL (ref 1.4–7.7)
Neutrophils Relative %: 60.1 % (ref 43.0–77.0)
PLATELETS: 289 10*3/uL (ref 150.0–400.0)
RBC: 5.43 Mil/uL (ref 4.22–5.81)
RDW: 13 % (ref 11.5–15.5)
WBC: 7.5 10*3/uL (ref 4.0–10.5)

## 2017-01-02 LAB — LIPID PANEL
CHOLESTEROL: 239 mg/dL — AB (ref 0–200)
HDL: 54.9 mg/dL (ref >39.00)
LDL CALC: 168 mg/dL — AB (ref 0–99)
NonHDL: 184.4
Total CHOL/HDL Ratio: 4
Triglycerides: 83 mg/dL (ref 0.0–149.0)
VLDL: 16.6 mg/dL (ref 0.0–40.0)

## 2017-01-02 LAB — TSH: TSH: 1.94 u[IU]/mL (ref 0.35–4.50)

## 2017-01-02 LAB — PSA: PSA: 4.21 ng/mL — AB (ref 0.10–4.00)

## 2017-01-02 MED ORDER — ATORVASTATIN CALCIUM 10 MG PO TABS: 10.0000 mg | ORAL_TABLET | Freq: Every day | ORAL | 2 refills | Status: DC

## 2017-01-02 MED ORDER — ROSUVASTATIN CALCIUM 5 MG PO TABS: 5.0000 mg | ORAL_TABLET | Freq: Every day | ORAL | 2 refills | Status: DC

## 2017-01-02 NOTE — Patient Instructions (Signed)

## 2017-01-02 NOTE — Telephone Encounter (Signed)
SW John over at [facility] and had him cancel Rx for rosuvastatin.   Pt advised and voiced understanding.

## 2017-01-02 NOTE — Telephone Encounter (Signed)
Patient mentioned he thinks Crestor was the statin he tried 6 or 7 years ago. He did not do well with that medication. Can you check? Please call patient.

## 2017-01-02 NOTE — Telephone Encounter (Signed)
Please advise. Thanks.  

## 2017-01-02 NOTE — Telephone Encounter (Signed)
I reviewed old meds.  Pt is correct.  He has had problems with simvastatin, rosuvastatin, and pravastatin. I just eRx'd atorvastatin for him to try.  Will you call his pharmacy and cancel the rosuvastatin that I eRxd earlier today?

## 2017-01-02 NOTE — Progress Notes (Signed)
Office Note 01/02/2017  CC:  Chief Complaint  Patient presents with  . Annual Exam    Pt is fasting.    HPI:  [name] is a 51 y.o. male who is here for annual health maintenance exam.  Has myalgias on simva and most recently on pravastatin. Stopped med, pain went away, restarted med and pain returned. Also with remote hx of same sx's on rosuvastatin.  Pt willing to do trial of new statin.  Hearing loss: pt has seen audiologist and doesn't want hearing aids.  Asks for referral for hearing aid implants.  Has complaint of 6+ wks pain in R foot near distal 4th and 5th metatarsal heads.  Thinks it started after he went hiking one day.  Worse with wt bearing.  The pain is not imroving.  No swellling, no redness.  Nothing applied to it.     Past Medical History:  Diagnosis Date  . Allergic rhinitis   . Essential hypertension   . GERD (gastroesophageal reflux disease)    hx of esophagitis  . History of gastritis   . Hyperlipemia     Past Surgical History:  Procedure Laterality Date  . APPENDECTOMY  1990  . COLONOSCOPY  06/2000; 03/03/14   2002 diverticulosis.  2015 NORMAL: recall 10 yrs  . ESOPHAGOGASTRODUODENOSCOPY  2002; 08/31/10   2002 esophagitis and gastroduodenitis.  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  . WRIST SURGERY Left 2009   reconstruction after fracture    Family History  Problem Relation Age of Onset  . Leukemia Mother   . Diabetes Mother   . Breast cancer Mother   . Lung cancer Father   . Diabetes Maternal Uncle   . Colon cancer Neg Hx   . Esophageal cancer Neg Hx   . Rectal cancer Neg Hx   . Stomach cancer Neg Hx     Social History   Social History  . Marital status: Divorced    Spouse name: N/A  . Number of children: 3  . Years of education: N/A   Occupational History  . self employed    Social History Main Topics  . Smoking status: Never Smoker  . Smokeless tobacco: Never Used  . Alcohol  use 0.6 oz/week    1 Shots of liquor per week  . Drug use: No  . Sexual activity: Yes   Other Topics Concern  . Not on file   Social History Narrative   Married, 3 children.   HS education.   Owns 3 advertising businesses.   No T/A/Ds.   He is a self-described workaholic.   Does CV/wt's workouts daily.    Outpatient Medications Prior to Visit  Medication Sig Dispense Refill  . aspirin 81 MG tablet Take 81 mg by mouth daily.      Marland Kitchen lisinopril (PRINIVIL,ZESTRIL) 10 MG tablet TAKE 1 TABLET (10 MG TOTAL) BY MOUTH DAILY. 90 tablet 1  . Multiple Vitamin (MULTIVITAMIN) tablet Take 1 tablet by mouth daily.    . pravastatin (PRAVACHOL) 40 MG tablet Take 1 tablet (40 mg total) by mouth daily. 90 tablet 1  . 0.9 %  sodium chloride infusion      No facility-administered medications prior to visit.     No Known Allergies  ROS Review of Systems  Constitutional: Negative for appetite change, chills, fatigue and fever.  HENT: Negative for congestion, dental problem, ear pain and sore throat.   Eyes: Negative for discharge, redness and visual disturbance.  Respiratory:  Negative for cough, chest tightness, shortness of breath and wheezing.   Cardiovascular: Negative for chest pain, palpitations and leg swelling.  Gastrointestinal: Negative for abdominal pain, blood in stool, diarrhea, nausea and vomiting.  Genitourinary: Negative for difficulty urinating, dysuria, flank pain, frequency, hematuria and urgency.  Musculoskeletal: Negative for arthralgias, back pain, joint swelling, myalgias and neck stiffness.  Skin: Negative for pallor and rash.  Neurological: Negative for dizziness, speech difficulty, weakness and headaches.  Hematological: Negative for adenopathy. Does not bruise/bleed easily.  Psychiatric/Behavioral: Negative for confusion and sleep disturbance. The patient is not nervous/anxious.     PE; Blood pressure 127/77, pulse (!) 55, temperature 98 F (36.7 C), temperature  source Oral, resp. rate 16, height 5\' 10"  (1.778 m), weight 207 lb (93.9 kg), SpO2 97 %. Gen: Alert, well appearing.  Patient is oriented to person, place, time, and situation. AFFECT: pleasant, lucid thought and speech. ENT: Ears: EACs clear, normal epithelium.  TMs with good light reflex and landmarks bilaterally.  Eyes: no injection, icteris, swelling, or exudate.  EOMI, PERRLA. Nose: no drainage or turbinate edema/swelling.  No injection or focal lesion.  Mouth: lips without lesion/swelling.  Oral mucosa pink and moist.  Dentition intact and without obvious caries or gingival swelling.  Oropharynx without erythema, exudate, or swelling.  Neck: supple/nontender.  No LAD, mass, or TM.  Carotid pulses 2+ bilaterally, without bruits. CV: RRR, no m/r/g.   LUNGS: CTA bilat, nonlabored resps, good aeration in all lung fields. ABD: soft, NT, ND, BS normal.  No hepatospenomegaly or mass.  No bruits. EXT: no clubbing, cyanosis, or edema.  Musculoskeletal: no joint swelling, erythema, warmth, or tenderness.  ROM of all joints intact. Skin - no sores or suspicious lesions or rashes or color changes R foot  Pertinent labs:  Lab Results  Component Value Date   TSH 1.94 01/02/2017   Lab Results  Component Value Date   WBC 7.5 01/02/2017   HGB 16.9 01/02/2017   HCT 50.6 01/02/2017   MCV 93.1 01/02/2017   PLT 289.0 01/02/2017   Lab Results  Component Value Date   CREATININE 0.95 01/02/2017   BUN 12 01/02/2017   NA 139 01/02/2017   K 4.9 01/02/2017   CL 102 01/02/2017   CO2 28 01/02/2017   Lab Results  Component Value Date   ALT 33 01/02/2017   AST 29 01/02/2017   ALKPHOS 62 01/02/2017   BILITOT 1.0 01/02/2017   Lab Results  Component Value Date   CHOL 239 (H) 01/02/2017   Lab Results  Component Value Date   HDL 54.90 01/02/2017   Lab Results  Component Value Date   LDLCALC 168 (H) 01/02/2017   Lab Results  Component Value Date   TRIG 83.0 01/02/2017   Lab Results   Component Value Date   CHOLHDL 4 01/02/2017   Lab Results  Component Value Date   PSA 4.21 (H) 01/02/2017   PSA 3.12 12/29/2015   PSA 2.77 12/22/2013    ASSESSMENT AND PLAN:   1) hearing impairment: pt declines to use standard hearing aids.  He wants hearing aid implants. I told him I would order referral if he got back to me with information about who to refer to.  2) Right foot pain, question of morton's neuroma. Refer to sports medicine.  3) Hyperlipidemia: statin intolerance (simva, prava, rosuva---myalgias). Will start trial of atorva 10mg  QOD.  Increase to qd after 2-3 weeks if no side effects from qod dosing. If pt stays on this  med, plan is to recheck FLP in 2-3 mon--lab visit.  4) Health maintenance exam: Reviewed age and gender appropriate health maintenance issues (prudent diet, regular exercise, health risks of tobacco and excessive alcohol, use of seatbelts, fire alarms in home, use of sunscreen).  Also reviewed age and gender appropriate health screening as well as vaccine recommendations. Vaccines: UTD.  Declines flu vaccine. Labs: fasting HP + PSA. Prostate ca screening: DRE normal , PSA today. Colon ca screening:  Next colonoscopy due 2025.  An After Visit Summary was printed and given to the patient.  FOLLOW UP:  Return in about 6 months (around 07/02/2017) for routine chronic illness f/u.  Signed:  Crissie Sickles, MD           01/02/2017

## 2017-01-03 ENCOUNTER — Other Ambulatory Visit: Payer: Self-pay | Admitting: Family Medicine

## 2017-01-03 DIAGNOSIS — R972 Elevated prostate specific antigen [PSA]: Secondary | ICD-10-CM

## 2017-01-03 LAB — HEPATITIS C ANTIBODY
Hepatitis C Ab: NONREACTIVE
SIGNAL TO CUT-OFF: 0 (ref <1.00)

## 2017-01-09 ENCOUNTER — Ambulatory Visit: Payer: BLUE CROSS/BLUE SHIELD | Admitting: Sports Medicine

## 2017-02-12 DIAGNOSIS — R972 Elevated prostate specific antigen [PSA]: Secondary | ICD-10-CM | POA: Diagnosis not present

## 2017-02-21 ENCOUNTER — Encounter: Payer: Self-pay | Admitting: Family Medicine

## 2017-02-21 ENCOUNTER — Ambulatory Visit (INDEPENDENT_AMBULATORY_CARE_PROVIDER_SITE_OTHER): Payer: BLUE CROSS/BLUE SHIELD | Admitting: Family Medicine

## 2017-02-21 VITALS — BP 113/68 | HR 63 | Temp 97.9°F | Resp 16 | Ht 70.0 in | Wt 213.0 lb

## 2017-02-21 DIAGNOSIS — L03011 Cellulitis of right finger: Secondary | ICD-10-CM

## 2017-02-21 NOTE — Progress Notes (Signed)
OFFICE VISIT  02/21/2017   CC:  Chief Complaint  Patient presents with  . Finger nail/cuticle infected   HPI:    Patient is a 51 y.o.  male who presents for swelling of right hand middle finger near the nail border. Onset about 24h ago, lots of pain started in area of medial nail border. No f/c/malaise.  Moving finger normally.   Past Medical History:  Diagnosis Date  . Allergic rhinitis   . Essential hypertension   . GERD (gastroesophageal reflux disease)    hx of esophagitis  . History of gastritis   . Hyperlipemia     Past Surgical History:  Procedure Laterality Date  . APPENDECTOMY  1990  . COLONOSCOPY  06/2000; 03/03/14   2002 diverticulosis.  2015 NORMAL: recall 10 yrs  . ESOPHAGOGASTRODUODENOSCOPY  2002; 08/31/10   2002 esophagitis and gastroduodenitis.  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  . WRIST SURGERY Left 2009   reconstruction after fracture    Outpatient Medications Prior to Visit  Medication Sig Dispense Refill  . aspirin 81 MG tablet Take 81 mg by mouth daily.      Marland Kitchen atorvastatin (LIPITOR) 10 MG tablet Take 1 tablet (10 mg total) by mouth daily. 30 tablet 2  . lisinopril (PRINIVIL,ZESTRIL) 10 MG tablet TAKE 1 TABLET (10 MG TOTAL) BY MOUTH DAILY. 90 tablet 1  . Multiple Vitamin (MULTIVITAMIN) tablet Take 1 tablet by mouth daily.     No facility-administered medications prior to visit.     No Known Allergies  ROS As per HPI  PE: Blood pressure 113/68, pulse 63, temperature 97.9 F (36.6 C), temperature source Oral, resp. rate 16, height 5\' 10"  (1.778 m), weight 213 lb (96.6 kg), SpO2 97 %. Gen: Alert, well appearing.  Patient is oriented to person, place, time, and situation. AFFECT: pleasant, lucid thought and speech. Right middle finger with mild erythema at medial nail fold, +TTP focally here, w/out any active drainage.  No significant swelling except a mild amount at the site of most tenderness.  DIP  joint ROM intact.  LABS:    Chemistry      Component Value Date/Time   NA 139 01/02/2017 0858   K 4.9 01/02/2017 0858   CL 102 01/02/2017 0858   CO2 28 01/02/2017 0858   BUN 12 01/02/2017 0858   CREATININE 0.95 01/02/2017 0858      Component Value Date/Time   CALCIUM 10.4 01/02/2017 0858   ALKPHOS 62 01/02/2017 0858   AST 29 01/02/2017 0858   ALT 33 01/02/2017 0858   BILITOT 1.0 01/02/2017 0858       IMPRESSION AND PLAN:  Paronychia, right hand middle finger. Used 18g needle to puncture the most translucent region of affected skin, got immediate return of white fluid--just a small amount.  Then a tiny bit of blood could be squeezed out.  Pt tolerated this well.  No immediate complications. Recommended epsom salt soaks 20 min bid, squeeze to express any remaining fluid when soaking it. Apply neosporin ointment to area with each dressing change/soak. Signs/symptoms to call or return for were reviewed and pt expressed understanding.  An After Visit Summary was printed and given to the patient.  FOLLOW UP: Return if symptoms worsen or fail to improve.  Signed:  Crissie Sickles, MD           02/21/2017

## 2017-03-29 DIAGNOSIS — M7742 Metatarsalgia, left foot: Secondary | ICD-10-CM | POA: Diagnosis not present

## 2017-03-29 DIAGNOSIS — M79672 Pain in left foot: Secondary | ICD-10-CM | POA: Diagnosis not present

## 2017-03-29 DIAGNOSIS — M9906 Segmental and somatic dysfunction of lower extremity: Secondary | ICD-10-CM | POA: Diagnosis not present

## 2017-03-30 ENCOUNTER — Other Ambulatory Visit: Payer: Self-pay | Admitting: Family Medicine

## 2017-04-03 ENCOUNTER — Other Ambulatory Visit (INDEPENDENT_AMBULATORY_CARE_PROVIDER_SITE_OTHER): Payer: BLUE CROSS/BLUE SHIELD

## 2017-04-03 DIAGNOSIS — E7800 Pure hypercholesterolemia, unspecified: Secondary | ICD-10-CM

## 2017-04-03 LAB — LIPID PANEL
CHOL/HDL RATIO: 3
Cholesterol: 177 mg/dL (ref 0–200)
HDL: 51.3 mg/dL (ref >39.00)
LDL Cholesterol: 109 mg/dL — ABNORMAL HIGH (ref 0–99)
NONHDL: 125.52
Triglycerides: 83 mg/dL (ref 0.0–149.0)
VLDL: 16.6 mg/dL (ref 0.0–40.0)

## 2017-04-04 ENCOUNTER — Encounter: Payer: Self-pay | Admitting: Family Medicine

## 2017-04-24 ENCOUNTER — Encounter: Payer: Self-pay | Admitting: Family Medicine

## 2017-04-24 ENCOUNTER — Ambulatory Visit: Payer: BLUE CROSS/BLUE SHIELD | Admitting: Family Medicine

## 2017-04-24 VITALS — BP 132/94 | HR 63 | Temp 97.8°F | Resp 16 | Wt 212.0 lb

## 2017-04-24 DIAGNOSIS — J0100 Acute maxillary sinusitis, unspecified: Secondary | ICD-10-CM | POA: Diagnosis not present

## 2017-04-24 DIAGNOSIS — J209 Acute bronchitis, unspecified: Secondary | ICD-10-CM | POA: Diagnosis not present

## 2017-04-24 MED ORDER — CEFDINIR 300 MG PO CAPS: 300.0000 mg | ORAL_CAPSULE | Freq: Two times a day (BID) | ORAL | 0 refills | Status: DC

## 2017-04-24 MED ORDER — HYDROCODONE-HOMATROPINE 5-1.5 MG/5ML PO SYRP: ORAL_SOLUTION | ORAL | 0 refills | Status: DC

## 2017-04-24 NOTE — Progress Notes (Signed)
OFFICE VISIT  04/24/2017   CC:  Chief Complaint  Patient presents with  . Sinusitis   HPI:    Patient is a 51 y.o.  male who presents for "head cold/congestion". About 1 mo of upper resp sx's: sT, nasal congestion/runny nose, coughing, waxing and waning intensity.  Pt describes "double sickening".  +Facial pressure, frontal/sinus HA. Much worse the last 4-6 days.  Some diffuse achiness.  Cough is worse.  No SOB or CP.  Cough worse supine. Had some diarrhea last 3d or so.  About 3 BMs per day, with some small volume and some bigger volume. He did eat at a local [place] prior to onset of his diarrhea.  No n/v. Appetite is fine.  NO abd pain.  Fatigued.   Past Medical History:  Diagnosis Date  . Allergic rhinitis   . Essential hypertension   . GERD (gastroesophageal reflux disease)    hx of esophagitis  . History of gastritis   . Hyperlipemia    statin started 12/2016--great improvement.    Past Surgical History:  Procedure Laterality Date  . APPENDECTOMY  1990  . COLONOSCOPY  06/2000; 03/03/14   2002 diverticulosis.  2015 NORMAL: recall 10 yrs  . ESOPHAGOGASTRODUODENOSCOPY  2002; 08/31/10   2002 esophagitis and gastroduodenitis.  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  . WRIST SURGERY Left 2009   reconstruction after fracture    Outpatient Medications Prior to Visit  Medication Sig Dispense Refill  . aspirin 81 MG tablet Take 81 mg by mouth daily.      Marland Kitchen atorvastatin (LIPITOR) 10 MG tablet TAKE 1 TABLET BY MOUTH EVERY DAY 90 tablet 1  . lisinopril (PRINIVIL,ZESTRIL) 10 MG tablet TAKE 1 TABLET (10 MG TOTAL) BY MOUTH DAILY. 90 tablet 1  . meloxicam (MOBIC) 15 MG tablet Take 15 mg by mouth daily.  1  . Multiple Vitamin (MULTIVITAMIN) tablet Take 1 tablet by mouth daily.    . tamsulosin (FLOMAX) 0.4 MG CAPS capsule Take 0.4 mg by mouth daily.  11   No facility-administered medications prior to visit.     No Known  Allergies  ROS As per HPI  PE: Blood pressure (!) 132/94, pulse 63, temperature 97.8 F (36.6 C), temperature source Oral, resp. rate 16, weight 212 lb (96.2 kg), SpO2 95 %. VS: noted--normal. Gen: alert, NAD, NONTOXIC APPEARING. HEENT: eyes without injection, drainage, or swelling.  Ears: EACs clear, TMs with normal light reflex and landmarks.  Nose: Clear rhinorrhea, with some dried, crusty exudate adherent to mildly injected mucosa.  No purulent d/c.  Mild diffuse paranasal sinus TTP.  No facial swelling.  Throat and mouth without focal lesion.  No pharyngial swelling, erythema, or exudate.   Neck: supple, no LAD.   LUNGS: CTA bilat, nonlabored resps.   CV: RRR, no m/r/g. EXT: no c/c/e SKIN: no rash   LABS:    Chemistry      Component Value Date/Time   NA 139 01/02/2017 0858   K 4.9 01/02/2017 0858   CL 102 01/02/2017 0858   CO2 28 01/02/2017 0858   BUN 12 01/02/2017 0858   CREATININE 0.95 01/02/2017 0858      Component Value Date/Time   CALCIUM 10.4 01/02/2017 0858   ALKPHOS 62 01/02/2017 0858   AST 29 01/02/2017 0858   ALT 33 01/02/2017 0858   BILITOT 1.0 01/02/2017 0858      IMPRESSION AND PLAN:  URI, with acute bronchitis--suspect bacterial etiology. Cefdinir 300 mg bid x  10d. Depo medrol 80mg  IM today. Hycodan syrup, 1-2 tsp bid prn, #240 ml. Rest, hydrate. Suspect recent diarrhea is linked to [place] food intake just prior to onset of sx's (wife had same GI sx's for 1-2d after eating same food).  An After Visit Summary was printed and given to the patient.  FOLLOW UP: Return if symptoms worsen or fail to improve.  Signed:  Crissie Sickles, MD           04/24/2017

## 2017-04-25 MED ORDER — METHYLPREDNISOLONE ACETATE 80 MG/ML IJ SUSP
80.0000 mg | Freq: Once | INTRAMUSCULAR | Status: AC
  Administered 2017-04-24: 80 mg via INTRAMUSCULAR

## 2017-04-25 NOTE — Addendum Note (Signed)
Addended by: Gordy Councilman on: 04/25/2017 08:37 AM   Modules accepted: Orders

## 2017-05-07 DIAGNOSIS — R972 Elevated prostate specific antigen [PSA]: Secondary | ICD-10-CM | POA: Diagnosis not present

## 2017-05-13 DIAGNOSIS — R972 Elevated prostate specific antigen [PSA]: Secondary | ICD-10-CM | POA: Diagnosis not present

## 2017-05-16 ENCOUNTER — Encounter: Payer: Self-pay | Admitting: Family Medicine

## 2017-05-22 ENCOUNTER — Telehealth: Payer: Self-pay | Admitting: Family Medicine

## 2017-05-22 NOTE — Telephone Encounter (Signed)
Patient wanted to let Dr. Anitra Lauth know that he is switching urologist to Dr. Nevada Crane with [facility]. No call back requested.

## 2017-05-22 NOTE — Telephone Encounter (Signed)
Noted  

## 2017-05-22 NOTE — Telephone Encounter (Signed)
FYI   Chart updated

## 2017-06-17 ENCOUNTER — Other Ambulatory Visit: Payer: Self-pay | Admitting: Family Medicine

## 2017-06-19 DIAGNOSIS — R972 Elevated prostate specific antigen [PSA]: Secondary | ICD-10-CM | POA: Diagnosis not present

## 2017-07-03 ENCOUNTER — Ambulatory Visit: Payer: BLUE CROSS/BLUE SHIELD | Admitting: Family Medicine

## 2017-07-05 DIAGNOSIS — Z0000 Encounter for general adult medical examination without abnormal findings: Secondary | ICD-10-CM | POA: Diagnosis not present

## 2017-07-05 DIAGNOSIS — R972 Elevated prostate specific antigen [PSA]: Secondary | ICD-10-CM | POA: Diagnosis not present

## 2017-07-05 DIAGNOSIS — C61 Malignant neoplasm of prostate: Secondary | ICD-10-CM | POA: Diagnosis not present

## 2017-07-15 DIAGNOSIS — C61 Malignant neoplasm of prostate: Secondary | ICD-10-CM | POA: Diagnosis not present

## 2017-07-24 DIAGNOSIS — C61 Malignant neoplasm of prostate: Secondary | ICD-10-CM | POA: Diagnosis not present

## 2017-08-01 DIAGNOSIS — I10 Essential (primary) hypertension: Secondary | ICD-10-CM | POA: Diagnosis not present

## 2017-08-01 DIAGNOSIS — C61 Malignant neoplasm of prostate: Secondary | ICD-10-CM | POA: Diagnosis not present

## 2017-08-06 ENCOUNTER — Telehealth: Payer: Self-pay

## 2017-08-06 NOTE — Telephone Encounter (Signed)
Copied from [place] [id number]. Topic: Appointment Scheduling - Scheduling Inquiry for Clinic >> Aug 06, 2017  9:04 AM Scherrie Gerlach wrote: Reason for CRM: pt needs to be seen today for on and off vertigo. Pt states he started started a new bottle of med on Sunday and that is when all this started. Not sure if that has anything to do with this.  Pt states he got really dizzy last night. Hopes he can be worked in. Pt was driving when he called

## 2017-08-06 NOTE — Telephone Encounter (Signed)
Spoke with patient regarding symptoms.  Patient reports dizziness Monday morning when he woke up x 1 hour. Experienced light-headedness throughout the day. Dizziness occurred again after bath Monday night and this morning. Patient reports sinus pressure/issues, afebrile. BP at home within normal range. Encouraged to drink plenty of fluids. Advised if dizziness re-occurs to not drive. Patient scheduled to see PCP tomorrow (Tuesday, 08/07/17) at 8am.

## 2017-08-07 ENCOUNTER — Encounter: Payer: Self-pay | Admitting: Family Medicine

## 2017-08-07 ENCOUNTER — Ambulatory Visit: Payer: BLUE CROSS/BLUE SHIELD | Admitting: Family Medicine

## 2017-08-07 VITALS — BP 134/82 | HR 68 | Resp 16 | Wt 198.0 lb

## 2017-08-07 DIAGNOSIS — H8111 Benign paroxysmal vertigo, right ear: Secondary | ICD-10-CM | POA: Diagnosis not present

## 2017-08-07 MED ORDER — MECLIZINE HCL 25 MG PO TABS: 25.0000 mg | ORAL_TABLET | Freq: Three times a day (TID) | ORAL | 0 refills | Status: DC | PRN

## 2017-08-07 NOTE — Progress Notes (Signed)
OFFICE VISIT  08/07/2017   CC:  Chief Complaint  Patient presents with  . Dizziness    sinus pressure, facial pain   HPI:    Patient is a 52 y.o. Caucasian male who presents for dizziness. Onset 3 d/a vertiginous sensation upon waking up in the morning.  Lasted < 1 min, felt a bit woozy for an hour.  Then went through is day but that evening after taking bath, another vertigo sensation.  Again the next day has had pretty frequent vertigo when turning head. He had vertigo in 1991.  ROS: sinus pressure lately.  Past Medical History:  Diagnosis Date  . Allergic rhinitis   . Essential hypertension   . GERD (gastroesophageal reflux disease)    hx of esophagitis  . History of gastritis   . Hyperlipemia    statin started 12/2016--great improvement.  . Prostate cancer [facility])    Rising gradually from 2015 to 2019.  Prostate bx planned as of 05/13/17 urol f/u.  Prostate bx 06/2017 adenocarcinoma.    Past Surgical History:  Procedure Laterality Date  . APPENDECTOMY  1990  . COLONOSCOPY  06/2000; 03/03/14   2002 diverticulosis.  2015 NORMAL: recall 10 yrs  . ESOPHAGOGASTRODUODENOSCOPY  2002; 08/31/10   2002 esophagitis and gastroduodenitis.  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  . WRIST SURGERY Left 2009   reconstruction after fracture    Outpatient Medications Prior to Visit  Medication Sig Dispense Refill  . atorvastatin (LIPITOR) 10 MG tablet TAKE 1 TABLET BY MOUTH EVERY DAY 90 tablet 1  . lisinopril (PRINIVIL,ZESTRIL) 10 MG tablet TAKE 1 TABLET BY MOUTH EVERY DAY 90 tablet 1  . aspirin 81 MG tablet Take 81 mg by mouth daily.      Marland Kitchen HYDROcodone-homatropine (HYCODAN) 5-1.5 MG/5ML syrup 1-2 tsp po bid prn cough (Patient not taking: Reported on 08/07/2017) 240 mL 0  . meloxicam (MOBIC) 15 MG tablet Take 15 mg by mouth daily.  1  . Multiple Vitamin (MULTIVITAMIN) tablet Take 1 tablet by mouth daily.    . tamsulosin (FLOMAX) 0.4 MG CAPS capsule Take  0.4 mg by mouth daily.  11  . cefdinir (OMNICEF) 300 MG capsule Take 1 capsule (300 mg total) by mouth 2 (two) times daily. (Patient not taking: Reported on 08/07/2017) 20 capsule 0   No facility-administered medications prior to visit.     No Known Allergies  ROS As per HPI  PE: Blood pressure 134/82, pulse 68, resp. rate 16, weight 198 lb (89.8 kg), SpO2 98 %. Gen: Alert, well appearing.  Patient is oriented to person, place, time, and situation. AFFECT: pleasant, lucid thought and speech. TGG:YIRS: no injection, icteris, swelling, or exudate.  EOMI, PERRLA. Mouth: lips without lesion/swelling.  Oral mucosa pink and moist. Oropharynx without erythema, exudate, or swelling.  CV: RRR, no m/r/g.   LUNGS: CTA bilat, nonlabored resps, good aeration in all lung fields. Neuro: CN 2-12 intact bilaterally, strength 5/5 in proximal and distal upper extremities and lower extremities bilaterally.  No sensory deficits.  No tremor.  No disdiadochokinesis.  No ataxia.  Upper extremity and lower extremity DTRs symmetric.  No pronator drift. DIx Halpike: +vertigo, nausea, and rotary nystagmus with head turned to R.  Neg to L.   LABS:    Chemistry      Component Value Date/Time   NA 139 01/02/2017 0858   K 4.9 01/02/2017 0858   CL 102 01/02/2017 0858   CO2 28 01/02/2017 0858   BUN  12 01/02/2017 0858   CREATININE 0.95 01/02/2017 0858      Component Value Date/Time   CALCIUM 10.4 01/02/2017 0858   ALKPHOS 62 01/02/2017 0858   AST 29 01/02/2017 0858   ALT 33 01/02/2017 0858   BILITOT 1.0 01/02/2017 0858     Lab Results  Component Value Date   WBC 7.5 01/02/2017   HGB 16.9 01/02/2017   HCT 50.6 01/02/2017   MCV 93.1 01/02/2017   PLT 289.0 01/02/2017     IMPRESSION AND PLAN:  BPPV: Discussed dx, pathophys, prognosis, tx. Home epley's maneuvers explained, handout given. Meclizine rx'd to help disequilibrium/lightheaded feeling in between vertigo episodes. Call/return if episodes  still occurring in 5d and we'll get him set up with vertigo PT.  An After Visit Summary was printed and given to the patient.  FOLLOW UP: Return if symptoms worsen or fail to improve in 5d.  Signed:  Crissie Sickles, MD           08/07/2017

## 2017-08-26 DIAGNOSIS — M9903 Segmental and somatic dysfunction of lumbar region: Secondary | ICD-10-CM | POA: Diagnosis not present

## 2017-08-26 DIAGNOSIS — M9904 Segmental and somatic dysfunction of sacral region: Secondary | ICD-10-CM | POA: Diagnosis not present

## 2017-08-26 DIAGNOSIS — S335XXA Sprain of ligaments of lumbar spine, initial encounter: Secondary | ICD-10-CM | POA: Diagnosis not present

## 2017-08-26 DIAGNOSIS — M5137 Other intervertebral disc degeneration, lumbosacral region: Secondary | ICD-10-CM | POA: Diagnosis not present

## 2017-08-28 DIAGNOSIS — S335XXA Sprain of ligaments of lumbar spine, initial encounter: Secondary | ICD-10-CM | POA: Diagnosis not present

## 2017-08-28 DIAGNOSIS — M9903 Segmental and somatic dysfunction of lumbar region: Secondary | ICD-10-CM | POA: Diagnosis not present

## 2017-08-28 DIAGNOSIS — M5137 Other intervertebral disc degeneration, lumbosacral region: Secondary | ICD-10-CM | POA: Diagnosis not present

## 2017-08-28 DIAGNOSIS — M9904 Segmental and somatic dysfunction of sacral region: Secondary | ICD-10-CM | POA: Diagnosis not present

## 2017-09-02 DIAGNOSIS — M5137 Other intervertebral disc degeneration, lumbosacral region: Secondary | ICD-10-CM | POA: Diagnosis not present

## 2017-09-02 DIAGNOSIS — M9903 Segmental and somatic dysfunction of lumbar region: Secondary | ICD-10-CM | POA: Diagnosis not present

## 2017-09-02 DIAGNOSIS — S335XXA Sprain of ligaments of lumbar spine, initial encounter: Secondary | ICD-10-CM | POA: Diagnosis not present

## 2017-09-02 DIAGNOSIS — M9904 Segmental and somatic dysfunction of sacral region: Secondary | ICD-10-CM | POA: Diagnosis not present

## 2017-09-08 ENCOUNTER — Other Ambulatory Visit: Payer: Self-pay | Admitting: Family Medicine

## 2017-09-11 DIAGNOSIS — S335XXA Sprain of ligaments of lumbar spine, initial encounter: Secondary | ICD-10-CM | POA: Diagnosis not present

## 2017-09-11 DIAGNOSIS — M9903 Segmental and somatic dysfunction of lumbar region: Secondary | ICD-10-CM | POA: Diagnosis not present

## 2017-09-11 DIAGNOSIS — M5137 Other intervertebral disc degeneration, lumbosacral region: Secondary | ICD-10-CM | POA: Diagnosis not present

## 2017-09-11 DIAGNOSIS — M9904 Segmental and somatic dysfunction of sacral region: Secondary | ICD-10-CM | POA: Diagnosis not present

## 2017-09-13 DIAGNOSIS — E785 Hyperlipidemia, unspecified: Secondary | ICD-10-CM | POA: Diagnosis not present

## 2017-09-13 DIAGNOSIS — I10 Essential (primary) hypertension: Secondary | ICD-10-CM | POA: Diagnosis not present

## 2017-09-13 DIAGNOSIS — C61 Malignant neoplasm of prostate: Secondary | ICD-10-CM | POA: Diagnosis not present

## 2017-09-18 ENCOUNTER — Telehealth: Payer: Self-pay | Admitting: *Deleted

## 2017-09-18 DIAGNOSIS — H9190 Unspecified hearing loss, unspecified ear: Secondary | ICD-10-CM | POA: Diagnosis not present

## 2017-09-18 DIAGNOSIS — I10 Essential (primary) hypertension: Secondary | ICD-10-CM | POA: Diagnosis not present

## 2017-09-18 DIAGNOSIS — C61 Malignant neoplasm of prostate: Secondary | ICD-10-CM | POA: Diagnosis not present

## 2017-09-18 DIAGNOSIS — E785 Hyperlipidemia, unspecified: Secondary | ICD-10-CM | POA: Diagnosis not present

## 2017-09-18 DIAGNOSIS — Z79899 Other long term (current) drug therapy: Secondary | ICD-10-CM | POA: Diagnosis not present

## 2017-09-18 HISTORY — PX: PROSTATECTOMY: SHX69

## 2017-09-18 NOTE — Telephone Encounter (Signed)
Copied from [place] [phone]. Topic: Quick Communication - See Telephone Encounter >> Sep 17, 2017  4:29 PM Antonieta Iba C wrote: CRM for notification. See Telephone encounter for: 09/17/17.  Threasa Beards [phone] RN Case Manager called in to give a curtesy call to make provider aware that pt has signed up for case management.

## 2017-09-18 NOTE — Telephone Encounter (Signed)
FYI

## 2017-09-18 NOTE — Telephone Encounter (Signed)
Noted  

## 2017-10-01 DIAGNOSIS — N529 Male erectile dysfunction, unspecified: Secondary | ICD-10-CM | POA: Diagnosis not present

## 2017-10-01 DIAGNOSIS — Z9079 Acquired absence of other genital organ(s): Secondary | ICD-10-CM | POA: Diagnosis not present

## 2017-10-01 DIAGNOSIS — C61 Malignant neoplasm of prostate: Secondary | ICD-10-CM | POA: Diagnosis not present

## 2017-10-01 DIAGNOSIS — N393 Stress incontinence (female) (male): Secondary | ICD-10-CM | POA: Diagnosis not present

## 2017-10-11 ENCOUNTER — Ambulatory Visit: Payer: BLUE CROSS/BLUE SHIELD | Admitting: Family Medicine

## 2017-10-11 ENCOUNTER — Encounter: Payer: Self-pay | Admitting: Family Medicine

## 2017-10-11 VITALS — BP 120/78 | HR 55 | Temp 98.6°F | Resp 16 | Ht 70.0 in | Wt 196.5 lb

## 2017-10-11 DIAGNOSIS — H9202 Otalgia, left ear: Secondary | ICD-10-CM

## 2017-10-11 DIAGNOSIS — H6982 Other specified disorders of Eustachian tube, left ear: Secondary | ICD-10-CM

## 2017-10-11 NOTE — Progress Notes (Signed)
OFFICE VISIT  10/11/2017   CC:  Chief Complaint  Patient presents with  . Ear Pain    left   HPI:    Patient is a 52 y.o. Caucasian male who presents for left ear pain. Onset L ear pain x 3 days ago.  No drainage.  No nasal congestion, runny nose, sneezing, coughing. No fevers with temp check but he did feel flushed/hot once. Tylenol q4h helps.  Pain returns the same after 4h.  Some ear drops from pharmacy yest: no change. No ST, no jaw pain.  Past Medical History:  Diagnosis Date  . Allergic rhinitis   . Essential hypertension   . GERD (gastroesophageal reflux disease)    hx of esophagitis  . History of gastritis   . Hyperlipemia    statin started 12/2016--great improvement.  . Prostate cancer [facility])    Rising gradually from 2015 to 2019.  Prostate bx 06/2017 adenocarcinoma.  Pt has had 2 additional opinions ([facility])--[facility] did prostatectomy 08/2017.    Past Surgical History:  Procedure Laterality Date  . APPENDECTOMY  1990  . COLONOSCOPY  06/2000; 03/03/14   2002 diverticulosis.  2015 NORMAL: recall 10 yrs  . ESOPHAGOGASTRODUODENOSCOPY  2002; 08/31/10   2002 esophagitis and gastroduodenitis.  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  Marland Kitchen PROSTATECTOMY  09/18/2017   [facility]  . WRIST SURGERY Left 2009   reconstruction after fracture    Outpatient Medications Prior to Visit  Medication Sig Dispense Refill  . atorvastatin (LIPITOR) 10 MG tablet TAKE 1 TABLET BY MOUTH EVERY DAY 90 tablet 1  . lisinopril (PRINIVIL,ZESTRIL) 10 MG tablet TAKE 1 TABLET BY MOUTH EVERY DAY 90 tablet 1  . tamsulosin (FLOMAX) 0.4 MG CAPS capsule Take 0.4 mg by mouth daily.  11  . aspirin 81 MG tablet Take 81 mg by mouth daily.      Marland Kitchen HYDROcodone-homatropine (HYCODAN) 5-1.5 MG/5ML syrup 1-2 tsp po bid prn cough (Patient not taking: Reported on 08/07/2017) 240 mL 0  . meclizine (ANTIVERT) 25 MG tablet Take 1 tablet (25 mg total) by mouth 3 (three) times daily as needed  for dizziness. (Patient not taking: Reported on 10/11/2017) 30 tablet 0  . meloxicam (MOBIC) 15 MG tablet Take 15 mg by mouth daily.  1  . Multiple Vitamin (MULTIVITAMIN) tablet Take 1 tablet by mouth daily.     No facility-administered medications prior to visit.     Allergies  Allergen Reactions  . Hydromorphone Other (See Comments)    Does not "touch" his pain  Does not work     ROS As per HPI  PE: Blood pressure 120/78, pulse (!) 55, temperature 98.6 F (37 C), temperature source Oral, resp. rate 16, height 5\' 10"  (1.778 m), weight 196 lb 8 oz (89.1 kg), SpO2 96 %. Gen: Alert, well appearing.  Patient is oriented to person, place, time, and situation. ENT: Ears: EACs clear, normal epithelium.  TMs with good light reflex and landmarks bilaterally.  TM's stiff, L>R. Left pinna is mildly TTP.  No erythema, swelling, or warmth of external ear on L.  No pre or post-auricular lymphadenopathy.  No TMJ tenderness.  Eyes: no injection, icteris, swelling, or exudate.  EOMI, PERRLA. Nose: no drainage or turbinate edema/swelling.  No injection or focal lesion.  Mouth: lips without lesion/swelling.  Oral mucosa pink and moist.  Dentition intact and without obvious caries or gingival swelling.  Oropharynx without erythema, exudate, or swelling.   LABS:  none  IMPRESSION  AND PLAN:  Left otalgia. Unclear etiology--exam is normal except some stiffness of TMs, L>R, when pt blows forcefully against closes nostrils. Will treat as left eustachian tube dysfunction. Flonase otc, 2 sprays L nostril qd. Afrin 2 sprays left nostril q12 prn. Instructions: Buy over the counter Flonase, 2 sprays each nostril once a day. Use over the counter generic afrin: 1-2 sprays in left nostril every 12 hours as needed----but to not use this more than 3 consecutive days AND monitor bp after taking this.  Do not take any further IF BP goes up to >150/90.  An After Visit Summary was printed and given to the  patient.  FOLLOW UP: Return if symptoms worsen or fail to improve.  Signed:  Crissie Sickles, MD           10/11/2017

## 2017-10-11 NOTE — Patient Instructions (Addendum)
Over the counter Flonase, 2 sprays each nostril once a day. Use over the counter generic afrin: 1-2 sprays in left nostril every 12 hours as needed----but to not use this more than 3 consecutive days AND monitor bp after taking this.  Do not take any further IF BP goes up to >150/90.

## 2017-10-16 DIAGNOSIS — M6258 Muscle wasting and atrophy, not elsewhere classified, other site: Secondary | ICD-10-CM | POA: Diagnosis not present

## 2017-10-16 DIAGNOSIS — N393 Stress incontinence (female) (male): Secondary | ICD-10-CM | POA: Diagnosis not present

## 2017-10-16 DIAGNOSIS — Z4889 Encounter for other specified surgical aftercare: Secondary | ICD-10-CM | POA: Diagnosis not present

## 2017-10-28 DIAGNOSIS — H903 Sensorineural hearing loss, bilateral: Secondary | ICD-10-CM

## 2017-10-28 DIAGNOSIS — Z822 Family history of deafness and hearing loss: Secondary | ICD-10-CM | POA: Diagnosis not present

## 2017-10-28 HISTORY — DX: Sensorineural hearing loss, bilateral: H90.3

## 2017-11-11 ENCOUNTER — Telehealth: Payer: Self-pay | Admitting: *Deleted

## 2017-11-11 DIAGNOSIS — H9193 Unspecified hearing loss, bilateral: Secondary | ICD-10-CM

## 2017-11-11 NOTE — Telephone Encounter (Signed)
OK, referral ordered as per pt request. 

## 2017-11-11 NOTE — Telephone Encounter (Signed)
Pt would like to have referral to [facility].  Their number is [phone]. Fax is [phone]. Pt would like to go there for hearing loss. He did not like the place in [place].  Please advise. Thanks.

## 2017-11-11 NOTE — Telephone Encounter (Signed)
Pt advised and voiced understanding.   

## 2017-11-11 NOTE — Telephone Encounter (Signed)
Copied from [place] [id number]. Topic: Referral - Request >> Nov 11, 2017 11:38 AM [name] wrote: Reason for CRM: pt would like to have referral to [facility].  Their number is [phone] Fax is [phone] Pt would like to go there for hearing loss.  He did not like the place in [place]

## 2017-11-13 DIAGNOSIS — Z4889 Encounter for other specified surgical aftercare: Secondary | ICD-10-CM | POA: Diagnosis not present

## 2017-11-13 DIAGNOSIS — M6258 Muscle wasting and atrophy, not elsewhere classified, other site: Secondary | ICD-10-CM | POA: Diagnosis not present

## 2017-11-13 DIAGNOSIS — N393 Stress incontinence (female) (male): Secondary | ICD-10-CM | POA: Diagnosis not present

## 2017-12-10 ENCOUNTER — Other Ambulatory Visit: Payer: Self-pay | Admitting: Family Medicine

## 2017-12-13 ENCOUNTER — Encounter: Payer: Self-pay | Admitting: Family Medicine

## 2017-12-25 DIAGNOSIS — H905 Unspecified sensorineural hearing loss: Secondary | ICD-10-CM | POA: Diagnosis not present

## 2017-12-25 DIAGNOSIS — H903 Sensorineural hearing loss, bilateral: Secondary | ICD-10-CM | POA: Insufficient documentation

## 2018-01-08 DIAGNOSIS — M47812 Spondylosis without myelopathy or radiculopathy, cervical region: Secondary | ICD-10-CM | POA: Diagnosis not present

## 2018-01-08 DIAGNOSIS — M9903 Segmental and somatic dysfunction of lumbar region: Secondary | ICD-10-CM | POA: Diagnosis not present

## 2018-01-08 DIAGNOSIS — M9901 Segmental and somatic dysfunction of cervical region: Secondary | ICD-10-CM | POA: Diagnosis not present

## 2018-01-08 DIAGNOSIS — M5137 Other intervertebral disc degeneration, lumbosacral region: Secondary | ICD-10-CM | POA: Diagnosis not present

## 2018-01-23 DIAGNOSIS — N5203 Combined arterial insufficiency and corporo-venous occlusive erectile dysfunction: Secondary | ICD-10-CM | POA: Insufficient documentation

## 2018-01-23 DIAGNOSIS — R32 Unspecified urinary incontinence: Secondary | ICD-10-CM | POA: Insufficient documentation

## 2018-01-24 DIAGNOSIS — C61 Malignant neoplasm of prostate: Secondary | ICD-10-CM | POA: Diagnosis not present

## 2018-01-24 DIAGNOSIS — N393 Stress incontinence (female) (male): Secondary | ICD-10-CM | POA: Diagnosis not present

## 2018-01-24 DIAGNOSIS — N5203 Combined arterial insufficiency and corporo-venous occlusive erectile dysfunction: Secondary | ICD-10-CM | POA: Diagnosis not present

## 2018-01-24 DIAGNOSIS — I10 Essential (primary) hypertension: Secondary | ICD-10-CM | POA: Diagnosis not present

## 2018-01-29 ENCOUNTER — Encounter: Payer: Self-pay | Admitting: Family Medicine

## 2018-01-29 ENCOUNTER — Ambulatory Visit (INDEPENDENT_AMBULATORY_CARE_PROVIDER_SITE_OTHER): Payer: BLUE CROSS/BLUE SHIELD | Admitting: Family Medicine

## 2018-01-29 VITALS — BP 143/73 | HR 57 | Temp 98.3°F | Resp 16 | Ht 70.0 in | Wt 206.4 lb

## 2018-01-29 DIAGNOSIS — I10 Essential (primary) hypertension: Secondary | ICD-10-CM

## 2018-01-29 DIAGNOSIS — E7800 Pure hypercholesterolemia, unspecified: Secondary | ICD-10-CM

## 2018-01-29 DIAGNOSIS — E663 Overweight: Secondary | ICD-10-CM | POA: Diagnosis not present

## 2018-01-29 DIAGNOSIS — Z0000 Encounter for general adult medical examination without abnormal findings: Secondary | ICD-10-CM | POA: Diagnosis not present

## 2018-01-29 DIAGNOSIS — H905 Unspecified sensorineural hearing loss: Secondary | ICD-10-CM | POA: Diagnosis not present

## 2018-01-29 LAB — COMPREHENSIVE METABOLIC PANEL
ALBUMIN: 4.6 g/dL (ref 3.5–5.2)
ALT: 27 U/L (ref 0–53)
AST: 23 U/L (ref 0–37)
Alkaline Phosphatase: 52 U/L (ref 39–117)
BUN: 15 mg/dL (ref 6–23)
CALCIUM: 9.6 mg/dL (ref 8.4–10.5)
CHLORIDE: 106 meq/L (ref 96–112)
CO2: 24 meq/L (ref 19–32)
CREATININE: 0.83 mg/dL (ref 0.40–1.50)
GFR: 102.5 mL/min (ref >60.00)
Glucose, Bld: 102 mg/dL — ABNORMAL HIGH (ref 70–99)
POTASSIUM: 4.5 meq/L (ref 3.5–5.1)
Sodium: 138 mEq/L (ref 135–145)
Total Bilirubin: 0.9 mg/dL (ref 0.2–1.2)
Total Protein: 7.2 g/dL (ref 6.0–8.3)

## 2018-01-29 LAB — CBC WITH DIFFERENTIAL/PLATELET
BASOS PCT: 0.4 % (ref 0.0–3.0)
Basophils Absolute: 0 10*3/uL (ref 0.0–0.1)
EOS ABS: 0.1 10*3/uL (ref 0.0–0.7)
EOS PCT: 1.1 % (ref 0.0–5.0)
HCT: 46.8 % (ref 39.0–52.0)
Hemoglobin: 16 g/dL (ref 13.0–17.0)
LYMPHS ABS: 2.2 10*3/uL (ref 0.7–4.0)
Lymphocytes Relative: 29.1 % (ref 12.0–46.0)
MCHC: 34.2 g/dL (ref 30.0–36.0)
MCV: 87 fl (ref 78.0–100.0)
MONO ABS: 0.8 10*3/uL (ref 0.1–1.0)
Monocytes Relative: 10.9 % (ref 3.0–12.0)
NEUTROS PCT: 58.5 % (ref 43.0–77.0)
Neutro Abs: 4.4 10*3/uL (ref 1.4–7.7)
Platelets: 269 10*3/uL (ref 150.0–400.0)
RBC: 5.38 Mil/uL (ref 4.22–5.81)
RDW: 14.1 % (ref 11.5–15.5)
WBC: 7.5 10*3/uL (ref 4.0–10.5)

## 2018-01-29 LAB — LIPID PANEL
CHOL/HDL RATIO: 3
CHOLESTEROL: 168 mg/dL (ref 0–200)
HDL: 50.7 mg/dL (ref >39.00)
LDL CALC: 102 mg/dL — AB (ref 0–99)
NonHDL: 117.44
TRIGLYCERIDES: 77 mg/dL (ref 0.0–149.0)
VLDL: 15.4 mg/dL (ref 0.0–40.0)

## 2018-01-29 LAB — TSH: TSH: 1.94 u[IU]/mL (ref 0.35–4.50)

## 2018-01-29 NOTE — Patient Instructions (Signed)

## 2018-01-29 NOTE — Progress Notes (Signed)
Office Note 01/29/2018  CC:  Chief Complaint  Patient presents with  . Annual Exam    Pt is fasting.     HPI:  [name] is a 52 y.o. White male who is here for annual health maintenance exam. Just got married, went to [place] for honeymoon!  Has had some mild cognitive impairment on viagra that he was on for "penile rehab" after prostate surg. Urol switched him from viagra to cialis 20mg  prn---he has not started it yet.  Pt read that lisinopril could also cause some MCI but pt has been on this med for a long time w/out problems.  He is doing well s/p prostatectomy for prostate ca--[facility].  Exercise: cardio every morning, walks 2-3 miles per week. Diet: fine.  Past Medical History:  Diagnosis Date  . Allergic rhinitis   . Bilateral sensorineural hearing loss 10/2017   [facility], hearing aids offered/recommended.  . Essential hypertension   . GERD (gastroesophageal reflux disease)    hx of esophagitis  . History of gastritis   . Hyperlipemia    statin started 12/2016--great improvement.  . Prostate cancer [facility])    Rising gradually from 2015 to 2019.  Prostate bx 06/2017 adenocarcinoma.  Pt has had 2 additional opinions ([facility])--[facility] did prostatectomy 08/2017.    Past Surgical History:  Procedure Laterality Date  . APPENDECTOMY  1990  . COLONOSCOPY  06/2000; 03/03/14   2002 diverticulosis.  2015 NORMAL: recall 10 yrs  . ESOPHAGOGASTRODUODENOSCOPY  2002; 08/31/10   2002 esophagitis and gastroduodenitis.  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  Marland Kitchen PROSTATECTOMY  09/18/2017   [facility]  . WRIST SURGERY Left 2009   reconstruction after fracture    Family History  Problem Relation Age of Onset  . Leukemia Mother   . Diabetes Mother   . Breast cancer Mother   . Lung cancer Father   . Diabetes Maternal Uncle   . Colon cancer Neg Hx   . Esophageal cancer Neg Hx   . Rectal cancer Neg Hx   . Stomach cancer Neg Hx      Social History   Socioeconomic History  . Marital status: Divorced    Spouse name: Not on file  . Number of children: 3  . Years of education: Not on file  . Highest education level: Not on file  Occupational History  . Occupation: self employed  Social Needs  . Financial resource strain: Not on file  . Food insecurity:    Worry: Not on file    Inability: Not on file  . Transportation needs:    Medical: Not on file    Non-medical: Not on file  Tobacco Use  . Smoking status: Never Smoker  . Smokeless tobacco: Never Used  Substance and Sexual Activity  . Alcohol use: Yes    Alcohol/week: 1.0 standard drinks    Types: 1 Shots of liquor per week  . Drug use: No  . Sexual activity: Yes  Lifestyle  . Physical activity:    Days per week: Not on file    Minutes per session: Not on file  . Stress: Not on file  Relationships  . Social connections:    Talks on phone: Not on file    Gets together: Not on file    Attends religious service: Not on file    Active member of club or organization: Not on file    Attends meetings of clubs or organizations: Not on file  Relationship status: Not on file  . Intimate partner violence:    Fear of current or ex partner: Not on file    Emotionally abused: Not on file    Physically abused: Not on file    Forced sexual activity: Not on file  Other Topics Concern  . Not on file  Social History Narrative   Married, 3 children.   HS education.   Owns 3 advertising businesses.   No T/A/Ds.   He is a self-described workaholic.   Does CV/wt's workouts daily.    Outpatient Medications Prior to Visit  Medication Sig Dispense Refill  . atorvastatin (LIPITOR) 10 MG tablet TAKE 1 TABLET BY MOUTH EVERY DAY 90 tablet 1  . lisinopril (PRINIVIL,ZESTRIL) 10 MG tablet TAKE 1 TABLET BY MOUTH EVERY DAY 90 tablet 0  . sildenafil (REVATIO) 20 MG tablet TAKE 2 TABS BY MOUTH 3 NIGHTS PER WEEK AT BEDTIME  6  . tamsulosin (FLOMAX) 0.4 MG CAPS capsule  Take 0.4 mg by mouth daily.  11   No facility-administered medications prior to visit.     Allergies  Allergen Reactions  . Hydromorphone Other (See Comments)    Does not "touch" his pain  Does not work     ROS Review of Systems  Constitutional: Negative for appetite change, chills, fatigue and fever.  HENT: Negative for congestion, dental problem, ear pain and sore throat.   Eyes: Negative for discharge, redness and visual disturbance.  Respiratory: Negative for cough, chest tightness, shortness of breath and wheezing.   Cardiovascular: Negative for chest pain, palpitations and leg swelling.  Gastrointestinal: Negative for abdominal pain, blood in stool, diarrhea, nausea and vomiting.  Genitourinary: Negative for difficulty urinating, dysuria, flank pain, frequency, hematuria and urgency.  Musculoskeletal: Negative for arthralgias, back pain, joint swelling, myalgias and neck stiffness.  Skin: Negative for pallor and rash.  Neurological: Negative for dizziness, speech difficulty, weakness and headaches.  Hematological: Negative for adenopathy. Does not bruise/bleed easily.  Psychiatric/Behavioral: Negative for confusion and sleep disturbance. The patient is not nervous/anxious.     PE; Blood pressure (!) 143/73, pulse (!) 57, temperature 98.3 F (36.8 C), temperature source Oral, resp. rate 16, height 5\' 10"  (1.778 m), weight 206 lb 6 oz (93.6 kg), SpO2 97 %. Body mass index is 29.61 kg/m.  Gen: Alert, well appearing.  Patient is oriented to person, place, time, and situation. AFFECT: pleasant, lucid thought and speech. ENT: Ears: EACs clear, normal epithelium.  TMs with good light reflex and landmarks bilaterally.  Eyes: no injection, icteris, swelling, or exudate.  EOMI, PERRLA. Nose: no drainage or turbinate edema/swelling.  No injection or focal lesion.  Mouth: lips without lesion/swelling.  Oral mucosa pink and moist.  Dentition intact and without obvious caries or gingival  swelling.  Oropharynx without erythema, exudate, or swelling.  Neck: supple/nontender.  No LAD, mass, or TM.  Carotid pulses 2+ bilaterally, without bruits. CV: RRR, no m/r/g.   LUNGS: CTA bilat, nonlabored resps, good aeration in all lung fields. ABD: soft, NT, ND, BS normal.  No hepatospenomegaly or mass.  No bruits. EXT: no clubbing, cyanosis, or edema.  Musculoskeletal: no joint swelling, erythema, warmth, or tenderness.  ROM of all joints intact. Skin - no sores or suspicious lesions or rashes or color changes Rectal: deferred  Pertinent labs:  Lab Results  Component Value Date   TSH 1.94 01/02/2017   Lab Results  Component Value Date   WBC 7.5 01/02/2017   HGB 16.9 01/02/2017  HCT 50.6 01/02/2017   MCV 93.1 01/02/2017   PLT 289.0 01/02/2017   Lab Results  Component Value Date   CREATININE 0.95 01/02/2017   BUN 12 01/02/2017   NA 139 01/02/2017   K 4.9 01/02/2017   CL 102 01/02/2017   CO2 28 01/02/2017   Lab Results  Component Value Date   ALT 33 01/02/2017   AST 29 01/02/2017   ALKPHOS 62 01/02/2017   BILITOT 1.0 01/02/2017   Lab Results  Component Value Date   CHOL 177 04/03/2017   Lab Results  Component Value Date   HDL 51.30 04/03/2017   Lab Results  Component Value Date   LDLCALC 109 (H) 04/03/2017   Lab Results  Component Value Date   TRIG 83.0 04/03/2017   Lab Results  Component Value Date   CHOLHDL 3 04/03/2017   Lab Results  Component Value Date   PSA 4.21 (H) 01/02/2017   PSA 3.12 12/29/2015   PSA 2.77 12/22/2013    ASSESSMENT AND PLAN:   Health maintenance exam: Reviewed age and gender appropriate health maintenance issues (prudent diet, regular exercise, health risks of tobacco and excessive alcohol, use of seatbelts, fire alarms in home, use of sunscreen).  Also reviewed age and gender appropriate health screening as well as vaccine recommendations. Vaccines: flu vaccine--> he declined flu today.  Shingrix-->pt to  consider. Labs: fasting HP today. Prostate ca screening: hx of prostatectomy for prostate ca May 2019-->urol doing PSA surveillance (PSA 0.0 at first post-op surveillance o/v). Colon ca screening: next colonoscopy due 2025.  Regarding his mild cognitive impairment lately (pt describes just not being as good at multi-tasking as he usually is), we'll see how he does after being taken off the fairly regular dosing of viagra that he was on s/p prostatectomy for "penile rehab". If not improved with the switch from viagra to 20mg  cialis prn dosing, we'll try switching his lisinopril to different bp med.  An After Visit Summary was printed and given to the patient.  FOLLOW UP:  Return in about 1 year (around 01/30/2019) for annual CPE (fasting).  Signed:  Crissie Sickles, MD           01/29/2018

## 2018-01-31 ENCOUNTER — Encounter: Payer: Self-pay | Admitting: Family Medicine

## 2018-02-26 DIAGNOSIS — H905 Unspecified sensorineural hearing loss: Secondary | ICD-10-CM | POA: Diagnosis not present

## 2018-03-17 ENCOUNTER — Other Ambulatory Visit: Payer: Self-pay | Admitting: Family Medicine

## 2018-03-20 ENCOUNTER — Encounter: Payer: Self-pay | Admitting: Family Medicine

## 2018-03-20 ENCOUNTER — Ambulatory Visit: Payer: BLUE CROSS/BLUE SHIELD | Admitting: Family Medicine

## 2018-03-20 VITALS — BP 133/79 | HR 48 | Temp 98.1°F | Resp 16 | Ht 70.0 in | Wt 206.5 lb

## 2018-03-20 DIAGNOSIS — J029 Acute pharyngitis, unspecified: Secondary | ICD-10-CM

## 2018-03-20 DIAGNOSIS — B9789 Other viral agents as the cause of diseases classified elsewhere: Secondary | ICD-10-CM

## 2018-03-20 DIAGNOSIS — J209 Acute bronchitis, unspecified: Secondary | ICD-10-CM

## 2018-03-20 DIAGNOSIS — J040 Acute laryngitis: Secondary | ICD-10-CM

## 2018-03-20 DIAGNOSIS — J069 Acute upper respiratory infection, unspecified: Secondary | ICD-10-CM

## 2018-03-20 LAB — POCT RAPID STREP A (OFFICE): Rapid Strep A Screen: NEGATIVE

## 2018-03-20 MED ORDER — HYDROCODONE-HOMATROPINE 5-1.5 MG/5ML PO SYRP: ORAL_SOLUTION | ORAL | 0 refills | Status: DC

## 2018-03-20 MED ORDER — METHYLPREDNISOLONE ACETATE 80 MG/ML IJ SUSP
80.0000 mg | Freq: Once | INTRAMUSCULAR | Status: AC
  Administered 2018-03-20: 80 mg via INTRAMUSCULAR

## 2018-03-20 NOTE — Addendum Note (Signed)
Addended by: Gordy Councilman on: 03/20/2018 10:47 AM   Modules accepted: Orders

## 2018-03-20 NOTE — Progress Notes (Signed)
OFFICE VISIT  03/20/2018   CC:  Chief Complaint  Patient presents with  . Sore Throat  . Cough   HPI:    Patient is a 52 y.o. Caucasian male who presents for ST and cough. Onset 6 d/a, ST, nasal congestion/runny nose, then developed signif cough a couple days later.  Then began to lose voice a few days ago.  Coughing fits hs impairing sleep.  Lots of mucous coming up.  No n/v.  Some diarrhea started yesterday. Two loose bm's last 24h.  No fevers. No malaise or body aches.  No rash.  No HA or neck stiffness. Mild fatigue.   Past Medical History:  Diagnosis Date  . Allergic rhinitis   . Bilateral sensorineural hearing loss 10/2017   [facility], hearing aids offered/recommended.  [place] 2nd opinion on 01/29/18-->same assessment, but they also recommended pt get MRI to r/o acoustic neuroma since his HL is asymmetric.  . Essential hypertension   . GERD (gastroesophageal reflux disease)    hx of esophagitis  . History of gastritis   . Hyperlipemia    statin started 12/2016--great improvement.  . Prostate cancer [facility])    Rising gradually from 2015 to 2019.  Prostate bx 06/2017 adenocarcinoma.  Pt has had 2 additional opinions ([facility])--[facility] did prostatectomy [facility]/2017.    Past Surgical History:  Procedure Laterality Date  . APPENDECTOMY  1990  . COLONOSCOPY  06/2000; 03/03/14   2002 diverticulosis.  2015 NORMAL: recall 10 yrs  . ESOPHAGOGASTRODUODENOSCOPY  2002; 08/31/10   2002 esophagitis and gastroduodenitis.  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  Marland Kitchen PROSTATECTOMY  09/18/2017   [facility]  . WRIST SURGERY Left 2009   reconstruction after fracture    Outpatient Medications Prior to Visit  Medication Sig Dispense Refill  . atorvastatin (LIPITOR) 10 MG tablet TAKE 1 TABLET BY MOUTH EVERY DAY 90 tablet 1  . lisinopril (PRINIVIL,ZESTRIL) 10 MG tablet TAKE 1 TABLET BY MOUTH EVERY DAY 90 tablet 0  . tadalafil (ADCIRCA/CIALIS) 20 MG tablet Take 20  mg by mouth daily.    . sildenafil (REVATIO) 20 MG tablet TAKE 2 TABS BY MOUTH 3 NIGHTS PER WEEK AT BEDTIME  6  . tamsulosin (FLOMAX) 0.4 MG CAPS capsule Take 0.4 mg by mouth daily.  11   No facility-administered medications prior to visit.     Allergies  Allergen Reactions  . Hydromorphone Other (See Comments)    Does not "touch" his pain  Does not work     ROS As per HPI  PE: Blood pressure 133/79, pulse (!) 48, temperature 98.1 F (36.7 C), temperature source Oral, resp. rate 16, height 5\' 10"  (1.778 m), weight 206 lb 8 oz (93.7 kg), SpO2 96 %. Body mass index is 29.63 kg/m.  VS: noted--normal. Gen: alert, NAD, NONTOXIC APPEARING. HEENT: eyes without injection, drainage, or swelling.  Ears: EACs clear, TMs with normal light reflex and landmarks.  Nose: Clear rhinorrhea, with some dried, crusty exudate adherent to mildly injected mucosa.  No purulent d/c.  No paranasal sinus TTP.  No facial swelling.  Throat and mouth without focal lesion.  No pharyngial swelling, erythema, or exudate.   Neck: supple, no LAD.   LUNGS: CTA bilat, nonlabored resps.   CV: RRR, no m/r/g. EXT: no c/c/e SKIN: no rash  LABS:    Chemistry      Component Value Date/Time   NA 138 01/29/2018 0853   K 4.5 01/29/2018 0853   CL 106 01/29/2018  0853   CO2 24 01/29/2018 0853   BUN 15 01/29/2018 0853   CREATININE 0.83 01/29/2018 0853      Component Value Date/Time   CALCIUM 9.6 01/29/2018 0853   ALKPHOS 52 01/29/2018 0853   AST 23 01/29/2018 0853   ALT 27 01/29/2018 0853   BILITOT 0.9 01/29/2018 0853     Lab Results  Component Value Date   WBC 7.5 01/29/2018   HGB 16.0 01/29/2018   HCT 46.8 01/29/2018   MCV 87.0 01/29/2018   PLT 269.0 01/29/2018   Rapid strep: neg  IMPRESSION AND PLAN:  Viral URI, laryngitis, bronchitis.  No sign of RAD or bacterial infection. Plan: 80 mg depo medrol in office today. Mucinex dm daytime. Hycodan syrup 1-2 tsp po qhs prn cough, #122ml eRx'd.   An  After Visit Summary was printed and given to the patient.  FOLLOW UP: No follow-ups on file.  Signed:  Crissie Sickles, MD           03/20/2018

## 2018-03-20 NOTE — Patient Instructions (Signed)
Get otc generic robitussin DM OR Mucinex DM and use as directed on the packaging for cough and congestion.

## 2018-04-30 DIAGNOSIS — S92911A Unspecified fracture of right toe(s), initial encounter for closed fracture: Secondary | ICD-10-CM

## 2018-04-30 HISTORY — DX: Unspecified fracture of right toe(s), initial encounter for closed fracture: S92.911A

## 2018-05-21 ENCOUNTER — Ambulatory Visit: Payer: Self-pay | Admitting: *Deleted
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
## 2018-05-21 NOTE — Telephone Encounter (Signed)
I agree with this triage.-thx

## 2018-05-22 ENCOUNTER — Ambulatory Visit (INDEPENDENT_AMBULATORY_CARE_PROVIDER_SITE_OTHER): Payer: BLUE CROSS/BLUE SHIELD

## 2018-05-22 ENCOUNTER — Ambulatory Visit (INDEPENDENT_AMBULATORY_CARE_PROVIDER_SITE_OTHER): Payer: BLUE CROSS/BLUE SHIELD | Admitting: Orthopaedic Surgery

## 2018-05-22 ENCOUNTER — Encounter (INDEPENDENT_AMBULATORY_CARE_PROVIDER_SITE_OTHER): Payer: Self-pay | Admitting: Orthopaedic Surgery

## 2018-05-22 DIAGNOSIS — S92515A Nondisplaced fracture of proximal phalanx of left lesser toe(s), initial encounter for closed fracture: Secondary | ICD-10-CM

## 2018-05-22 DIAGNOSIS — M79671 Pain in right foot: Secondary | ICD-10-CM

## 2018-05-22 DIAGNOSIS — S92919A Unspecified fracture of unspecified toe(s), initial encounter for closed fracture: Secondary | ICD-10-CM

## 2018-05-22 MED ORDER — TIZANIDINE HCL 4 MG PO TABS: 4.0000 mg | ORAL_TABLET | Freq: Three times a day (TID) | ORAL | 0 refills | Status: DC | PRN
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
## 2018-05-28 ENCOUNTER — Encounter: Payer: Self-pay | Admitting: Family Medicine

## 2018-05-30 DIAGNOSIS — C61 Malignant neoplasm of prostate: Secondary | ICD-10-CM | POA: Diagnosis not present

## 2018-05-30 DIAGNOSIS — H905 Unspecified sensorineural hearing loss: Secondary | ICD-10-CM | POA: Diagnosis not present

## 2018-06-11 ENCOUNTER — Other Ambulatory Visit: Payer: Self-pay | Admitting: Family Medicine

## 2018-06-12 ENCOUNTER — Ambulatory Visit (INDEPENDENT_AMBULATORY_CARE_PROVIDER_SITE_OTHER): Payer: BLUE CROSS/BLUE SHIELD

## 2018-06-12 ENCOUNTER — Ambulatory Visit (INDEPENDENT_AMBULATORY_CARE_PROVIDER_SITE_OTHER): Payer: BLUE CROSS/BLUE SHIELD | Admitting: Orthopaedic Surgery

## 2018-06-12 ENCOUNTER — Encounter (INDEPENDENT_AMBULATORY_CARE_PROVIDER_SITE_OTHER): Payer: Self-pay | Admitting: Orthopaedic Surgery

## 2018-06-12 DIAGNOSIS — M79671 Pain in right foot: Secondary | ICD-10-CM | POA: Diagnosis not present

## 2018-06-12 DIAGNOSIS — S92919A Unspecified fracture of unspecified toe(s), initial encounter for closed fracture: Secondary | ICD-10-CM | POA: Diagnosis not present

## 2018-06-12 NOTE — Progress Notes (Signed)
Patient is very active 53 year old gentleman I am seeing in follow-up for having sustained a third toe proximal phalanx fracture of his right foot a few weeks ago.  This was very painful at the time.  It is been easing off of the pain but he still has pain when walking long distances.  On examination his foot there is still some swelling about the third toe proximal phalanx but is well located.  There is no bruising.  He is neurovascular intact.  3 views of the right foot are obtained and show interval healing of the fracture line that was more easily visible few weeks ago through his proximal phalanx of the third toe.  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  He is asymptomatic from this.  At this point he will follow-up as needed.  He understands he must avoid any high impact aerobic activities for at least another 4 to 6 weeks as this heals I do not expect him to have long-term problems.  All question concerns were answered and addressed.

## 2018-07-11 ENCOUNTER — Encounter: Payer: Self-pay | Admitting: Family Medicine

## 2018-07-11 ENCOUNTER — Other Ambulatory Visit: Payer: Self-pay

## 2018-07-11 ENCOUNTER — Ambulatory Visit: Payer: BLUE CROSS/BLUE SHIELD | Admitting: Family Medicine

## 2018-07-11 VITALS — BP 126/71 | HR 61 | Temp 97.9°F | Resp 16 | Ht 70.0 in | Wt 202.6 lb

## 2018-07-11 DIAGNOSIS — B349 Viral infection, unspecified: Secondary | ICD-10-CM | POA: Diagnosis not present

## 2018-07-11 MED ORDER — METHYLPREDNISOLONE ACETATE 40 MG/ML IJ SUSP
40.0000 mg | Freq: Once | INTRAMUSCULAR | Status: AC
  Administered 2018-07-11: 40 mg via INTRAMUSCULAR

## 2018-07-11 NOTE — Addendum Note (Signed)
Addended by: Onalee Hua on: 07/11/2018 09:47 AM   Modules accepted: Orders
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
## 2018-09-26 DIAGNOSIS — C61 Malignant neoplasm of prostate: Secondary | ICD-10-CM | POA: Diagnosis not present

## 2018-09-26 DIAGNOSIS — N5203 Combined arterial insufficiency and corporo-venous occlusive erectile dysfunction: Secondary | ICD-10-CM | POA: Diagnosis not present

## 2018-09-26 DIAGNOSIS — N3942 Incontinence without sensory awareness: Secondary | ICD-10-CM | POA: Diagnosis not present

## 2018-11-05 ENCOUNTER — Other Ambulatory Visit: Payer: Self-pay

## 2018-11-05 ENCOUNTER — Ambulatory Visit: Payer: Self-pay

## 2018-11-05 ENCOUNTER — Encounter: Payer: Self-pay | Admitting: Orthopaedic Surgery

## 2018-11-05 ENCOUNTER — Ambulatory Visit (INDEPENDENT_AMBULATORY_CARE_PROVIDER_SITE_OTHER): Payer: BC Managed Care – PPO | Admitting: Orthopaedic Surgery

## 2018-11-05 DIAGNOSIS — M79671 Pain in right foot: Secondary | ICD-10-CM | POA: Diagnosis not present
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
## 2018-11-06 ENCOUNTER — Other Ambulatory Visit: Payer: Self-pay

## 2018-11-06 DIAGNOSIS — M79671 Pain in right foot: Secondary | ICD-10-CM

## 2018-11-11 ENCOUNTER — Telehealth: Payer: Self-pay | Admitting: Orthopaedic Surgery

## 2018-11-11 NOTE — Telephone Encounter (Signed)
Patient aware of the below message  

## 2018-11-11 NOTE — Telephone Encounter (Signed)
It is actually Voltaren Gel.  Let him know that it is over-the-counter now and he can get it from his pharmacy or [facility] or [facility].

## 2018-11-11 NOTE — Telephone Encounter (Signed)
What did you want to call in for him? Pennsaid maybe?

## 2018-11-11 NOTE — Telephone Encounter (Signed)
Patient called advised the cream for his foot was not called into the pharmacy. [facility] [place]. The number to contact patient is [phone]

## 2018-11-17 ENCOUNTER — Ambulatory Visit: Payer: BC Managed Care – PPO | Admitting: Orthopaedic Surgery

## 2018-11-29 ENCOUNTER — Other Ambulatory Visit: Payer: Self-pay | Admitting: Family Medicine

## 2018-12-03 ENCOUNTER — Other Ambulatory Visit: Payer: Self-pay

## 2018-12-03 ENCOUNTER — Ambulatory Visit
Admission: RE | Admit: 2018-12-03 | Discharge: 2018-12-03 | Disposition: A | Discharge status: Home / Self Care | Payer: PRIVATE HEALTH INSURANCE | Source: Ambulatory Visit | Attending: Orthopaedic Surgery | Admitting: Orthopaedic Surgery

## 2018-12-03 DIAGNOSIS — M79671 Pain in right foot: Secondary | ICD-10-CM | POA: Diagnosis not present

## 2018-12-10 ENCOUNTER — Encounter: Payer: Self-pay | Admitting: Orthopaedic Surgery

## 2018-12-10 ENCOUNTER — Other Ambulatory Visit: Payer: Self-pay

## 2018-12-10 ENCOUNTER — Ambulatory Visit (INDEPENDENT_AMBULATORY_CARE_PROVIDER_SITE_OTHER): Payer: BC Managed Care – PPO | Admitting: Orthopaedic Surgery

## 2018-12-10 DIAGNOSIS — S92501K Displaced unspecified fracture of right lesser toe(s), subsequent encounter for fracture with nonunion: Secondary | ICD-10-CM | POA: Diagnosis not present

## 2018-12-10 DIAGNOSIS — S92901G Unspecified fracture of right foot, subsequent encounter for fracture with delayed healing: Secondary | ICD-10-CM

## 2018-12-10 DIAGNOSIS — S92919A Unspecified fracture of unspecified toe(s), initial encounter for closed fracture: Secondary | ICD-10-CM | POA: Diagnosis not present
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
## 2018-12-24 ENCOUNTER — Other Ambulatory Visit: Payer: Self-pay

## 2018-12-24 ENCOUNTER — Ambulatory Visit
Admission: RE | Admit: 2018-12-24 | Discharge: 2018-12-24 | Disposition: A | Discharge status: Home / Self Care | Payer: BC Managed Care – PPO | Source: Ambulatory Visit | Attending: Orthopaedic Surgery | Admitting: Orthopaedic Surgery

## 2018-12-24 ENCOUNTER — Other Ambulatory Visit: Payer: PRIVATE HEALTH INSURANCE

## 2018-12-24 DIAGNOSIS — S92901G Unspecified fracture of right foot, subsequent encounter for fracture with delayed healing: Secondary | ICD-10-CM

## 2018-12-27 ENCOUNTER — Encounter: Payer: Self-pay | Admitting: Family Medicine

## 2018-12-31 ENCOUNTER — Encounter: Payer: Self-pay | Admitting: Orthopaedic Surgery

## 2018-12-31 ENCOUNTER — Ambulatory Visit (INDEPENDENT_AMBULATORY_CARE_PROVIDER_SITE_OTHER): Payer: BC Managed Care – PPO | Admitting: Orthopaedic Surgery

## 2018-12-31 DIAGNOSIS — S92501K Displaced unspecified fracture of right lesser toe(s), subsequent encounter for fracture with nonunion: Secondary | ICD-10-CM | POA: Diagnosis not present
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
## 2019-01-13 ENCOUNTER — Encounter: Payer: Self-pay | Admitting: Orthopedic Surgery

## 2019-01-13 ENCOUNTER — Ambulatory Visit (INDEPENDENT_AMBULATORY_CARE_PROVIDER_SITE_OTHER): Payer: BC Managed Care – PPO | Admitting: Orthopedic Surgery

## 2019-01-13 VITALS — Ht 70.0 in | Wt 202.6 lb

## 2019-01-13 DIAGNOSIS — S92501A Displaced unspecified fracture of right lesser toe(s), initial encounter for closed fracture: Secondary | ICD-10-CM | POA: Diagnosis not present

## 2019-01-14 ENCOUNTER — Encounter: Payer: Self-pay | Admitting: Orthopedic Surgery
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
## 2019-02-03 ENCOUNTER — Encounter: Payer: Self-pay | Admitting: Family Medicine

## 2019-02-04 ENCOUNTER — Encounter: Payer: BLUE CROSS/BLUE SHIELD | Admitting: Family Medicine

## 2019-02-25 ENCOUNTER — Other Ambulatory Visit: Payer: Self-pay | Admitting: Family Medicine

## 2019-03-04 ENCOUNTER — Other Ambulatory Visit: Payer: Self-pay

## 2019-03-04 ENCOUNTER — Ambulatory Visit (INDEPENDENT_AMBULATORY_CARE_PROVIDER_SITE_OTHER): Payer: BC Managed Care – PPO | Admitting: Family Medicine

## 2019-03-04 ENCOUNTER — Encounter: Payer: Self-pay | Admitting: Family Medicine

## 2019-03-04 VITALS — BP 162/79 | HR 50 | Temp 98.3°F | Resp 16 | Ht 70.0 in | Wt 208.8 lb

## 2019-03-04 DIAGNOSIS — Z23 Encounter for immunization: Secondary | ICD-10-CM

## 2019-03-04 DIAGNOSIS — E7800 Pure hypercholesterolemia, unspecified: Secondary | ICD-10-CM | POA: Diagnosis not present

## 2019-03-04 DIAGNOSIS — I10 Essential (primary) hypertension: Secondary | ICD-10-CM

## 2019-03-04 DIAGNOSIS — Z0000 Encounter for general adult medical examination without abnormal findings: Secondary | ICD-10-CM

## 2019-03-04 LAB — CBC WITH DIFFERENTIAL/PLATELET
Basophils Absolute: 0.1 10*3/uL (ref 0.0–0.1)
Basophils Relative: 1.1 % (ref 0.0–3.0)
Eosinophils Absolute: 0.1 10*3/uL (ref 0.0–0.7)
Eosinophils Relative: 1.2 % (ref 0.0–5.0)
HCT: 45.9 % (ref 39.0–52.0)
Hemoglobin: 15.5 g/dL (ref 13.0–17.0)
Lymphocytes Relative: 29.5 % (ref 12.0–46.0)
Lymphs Abs: 2.5 10*3/uL (ref 0.7–4.0)
MCHC: 33.7 g/dL (ref 30.0–36.0)
MCV: 91.3 fl (ref 78.0–100.0)
Monocytes Absolute: 0.9 10*3/uL (ref 0.1–1.0)
Monocytes Relative: 10.4 % (ref 3.0–12.0)
Neutro Abs: 4.8 10*3/uL (ref 1.4–7.7)
Neutrophils Relative %: 57.8 % (ref 43.0–77.0)
Platelets: 261 10*3/uL (ref 150.0–400.0)
RBC: 5.03 Mil/uL (ref 4.22–5.81)
RDW: 13.2 % (ref 11.5–15.5)
WBC: 8.3 10*3/uL (ref 4.0–10.5)

## 2019-03-04 LAB — COMPREHENSIVE METABOLIC PANEL
ALT: 27 U/L (ref 0–53)
AST: 21 U/L (ref 0–37)
Albumin: 4.6 g/dL (ref 3.5–5.2)
Alkaline Phosphatase: 65 U/L (ref 39–117)
BUN: 11 mg/dL (ref 6–23)
CO2: 26 mEq/L (ref 19–32)
Calcium: 9.5 mg/dL (ref 8.4–10.5)
Chloride: 105 mEq/L (ref 96–112)
Creatinine, Ser: 0.86 mg/dL (ref 0.40–1.50)
GFR: 92.2 mL/min (ref >60.00)
Glucose, Bld: 99 mg/dL (ref 70–99)
Potassium: 4.8 mEq/L (ref 3.5–5.1)
Sodium: 139 mEq/L (ref 135–145)
Total Bilirubin: 0.6 mg/dL (ref 0.2–1.2)
Total Protein: 7 g/dL (ref 6.0–8.3)

## 2019-03-04 LAB — LIPID PANEL
Cholesterol: 173 mg/dL (ref 0–200)
HDL: 50.9 mg/dL (ref >39.00)
LDL Cholesterol: 109 mg/dL — ABNORMAL HIGH (ref 0–99)
NonHDL: 121.98
Total CHOL/HDL Ratio: 3
Triglycerides: 66 mg/dL (ref 0.0–149.0)
VLDL: 13.2 mg/dL (ref 0.0–40.0)

## 2019-03-04 LAB — TSH: TSH: 1.94 u[IU]/mL (ref 0.35–4.50)
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
## 2019-03-04 NOTE — Patient Instructions (Signed)
Purchase an automated blood pressure cuff (upper arm cuff) at any pharmacy. Check your blood pressure a couple of days per week.  Goal bp <130 on top and <80 on bottom. If not consistently at or very near this goal then call or return.  Health Maintenance, Male Adopting a healthy lifestyle and getting preventive care are important in promoting health and wellness. Ask your health care provider about:  The right schedule for you to have regular tests and exams.  Things you can do on your own to prevent diseases and keep yourself healthy. What should I know about diet, weight, and exercise? Eat a healthy diet   Eat a diet that includes plenty of vegetables, fruits, low-fat dairy products, and lean protein.  Do not eat a lot of foods that are high in solid fats, added sugars, or sodium. Maintain a healthy weight Body mass index (BMI) is a measurement that can be used to identify possible weight problems. It estimates body fat based on height and weight. Your health care provider can help determine your BMI and help you achieve or maintain a healthy weight. Get regular exercise Get regular exercise. This is one of the most important things you can do for your health. Most adults should:  Exercise for at least 150 minutes each week. The exercise should increase your heart rate and make you sweat (moderate-intensity exercise).  Do strengthening exercises at least twice a week. This is in addition to the moderate-intensity exercise.  Spend less time sitting. Even light physical activity can be beneficial. Watch cholesterol and blood lipids Have your blood tested for lipids and cholesterol at 55 years of age, then have this test every 5 years. You may need to have your cholesterol levels checked more often if:  Your lipid or cholesterol levels are high.  You are older than 55 years of age.  You are at high risk for heart disease. What should I know about cancer screening? Many types of  cancers can be detected early and may often be prevented. Depending on your health history and family history, you may need to have cancer screening at various ages. This may include screening for:  Colorectal cancer.  Prostate cancer.  Skin cancer.  Lung cancer. What should I know about heart disease, diabetes, and high blood pressure? Blood pressure and heart disease  High blood pressure causes heart disease and increases the risk of stroke. This is more likely to develop in people who have high blood pressure readings, are of African descent, or are overweight.  Talk with your health care provider about your target blood pressure readings.  Have your blood pressure checked: ? Every 3-5 years if you are 62-61 years of age. ? Every year if you are 67 years old or older.  If you are between the ages of 35 and 16 and are a current or former smoker, ask your health care provider if you should have a one-time screening for abdominal aortic aneurysm (AAA). Diabetes Have regular diabetes screenings. This checks your fasting blood sugar level. Have the screening done:  Once every three years after age 52 if you are at a normal weight and have a low risk for diabetes.  More often and at a younger age if you are overweight or have a high risk for diabetes. What should I know about preventing infection? Hepatitis B If you have a higher risk for hepatitis B, you should be screened for this virus. Talk with your health care provider  to find out if you are at risk for hepatitis B infection. Hepatitis C Blood testing is recommended for:  Everyone born from [birth year] through 1967.  Anyone with known risk factors for hepatitis C. Sexually transmitted infections (STIs)  You should be screened each year for STIs, including gonorrhea and chlamydia, if: ? You are sexually active and are younger than 55 years of age. ? You are older than 55 years of age and your health care provider tells you that you  are at risk for this type of infection. ? Your sexual activity has changed since you were last screened, and you are at increased risk for chlamydia or gonorrhea. Ask your health care provider if you are at risk.  Ask your health care provider about whether you are at high risk for HIV. Your health care provider may recommend a prescription medicine to help prevent HIV infection. If you choose to take medicine to prevent HIV, you should first get tested for HIV. You should then be tested every 3 months for as long as you are taking the medicine. Follow these instructions at home: Lifestyle  Do not use any products that contain nicotine or tobacco, such as cigarettes, e-cigarettes, and chewing tobacco. If you need help quitting, ask your health care provider.  Do not use street drugs.  Do not share needles.  Ask your health care provider for help if you need support or information about quitting drugs. Alcohol use  Do not drink alcohol if your health care provider tells you not to drink.  If you drink alcohol: ? Limit how much you have to 0-2 drinks a day. ? Be aware of how much alcohol is in your drink. In the [place] one drink equals one 12 oz bottle of beer (355 mL), one 5 oz glass of wine (148 mL), or one 1 oz glass of hard liquor (44 mL). General instructions  Schedule regular health, dental, and eye exams.  Stay current with your vaccines.  Tell your health care provider if: ? You often feel depressed. ? You have ever been abused or do not feel safe at home. Summary  Adopting a healthy lifestyle and getting preventive care are important in promoting health and wellness.  Follow your health care provider's instructions about healthy diet, exercising, and getting tested or screened for diseases.  Follow your health care provider's instructions on monitoring your cholesterol and blood pressure. This information is not intended to replace advice given to you by your health care  provider. Make sure you discuss any questions you have with your health care provider. Document Released: 10/13/2007 Document Revised: 04/09/2018 Document Reviewed: 04/09/2018 Elsevier Patient Education  2020 [employer].

## 2019-03-24 DIAGNOSIS — H905 Unspecified sensorineural hearing loss: Secondary | ICD-10-CM | POA: Diagnosis not present

## 2019-03-24 DIAGNOSIS — N5203 Combined arterial insufficiency and corporo-venous occlusive erectile dysfunction: Secondary | ICD-10-CM | POA: Diagnosis not present

## 2019-03-24 DIAGNOSIS — C61 Malignant neoplasm of prostate: Secondary | ICD-10-CM | POA: Diagnosis not present

## 2019-03-24 DIAGNOSIS — N3942 Incontinence without sensory awareness: Secondary | ICD-10-CM | POA: Diagnosis not present

## 2019-05-24 ENCOUNTER — Other Ambulatory Visit: Payer: Self-pay | Admitting: Family Medicine

## 2019-06-22 ENCOUNTER — Telehealth: Payer: Self-pay | Admitting: Orthopaedic Surgery

## 2019-06-22 NOTE — Telephone Encounter (Signed)
Patient came by office to chech on records request. He was advised they were mailed to him on 1/27. He stated he has not received them and asked to get a copy while here. I printed another copy and gave to patient.

## 2019-08-18 ENCOUNTER — Other Ambulatory Visit: Payer: Self-pay | Admitting: Family Medicine

## 2019-08-20 ENCOUNTER — Ambulatory Visit: Payer: Self-pay

## 2019-08-20 ENCOUNTER — Encounter: Payer: Self-pay | Admitting: Physician Assistant

## 2019-08-20 ENCOUNTER — Other Ambulatory Visit: Payer: Self-pay

## 2019-08-20 ENCOUNTER — Ambulatory Visit: Payer: BC Managed Care – PPO | Admitting: Physician Assistant

## 2019-08-20 DIAGNOSIS — M7051 Other bursitis of knee, right knee: Secondary | ICD-10-CM | POA: Diagnosis not present

## 2019-08-20 DIAGNOSIS — M25561 Pain in right knee: Secondary | ICD-10-CM

## 2019-08-20 MED ORDER — LIDOCAINE HCL 1 % IJ SOLN
1.0000 mL | INTRAMUSCULAR | Status: AC | PRN
  Administered 2019-08-20: 1 mL

## 2019-08-20 MED ORDER — METHYLPREDNISOLONE ACETATE 40 MG/ML IJ SUSP
40.0000 mg | INTRAMUSCULAR | Status: AC | PRN
  Administered 2019-08-20: 40 mg via INTRAMUSCULAR

## 2019-08-20 NOTE — Patient Instructions (Signed)
Pes Anserine Bursitis  The pes anserine is an area on the inside of your knee, just below the joint, that is cushioned by a fluid-filled sac (bursa). Pes anserine bursitis is a condition that happens when the bursa gets swollen and irritated. The condition causes knee pain. What are the causes? This condition may be caused by:  Making the same movement over and over.  A direct hit (trauma) to the inside of the leg. What increases the risk? You are more likely to develop this condition if you:  Are a runner.  Play sports that involve a lot of running and quick side-to-side movements (cutting).  Are an athlete who plays contact sports.  Swim using an inward angle of the knee, such as with the breaststroke.  Have tight hamstring muscles.  Are a woman.  Are overweight.  Have flat feet.  Have diabetes or osteoarthritis. What are the signs or symptoms? Symptoms of this condition include:  Knee pain that gets better with rest and worse with activities like climbing stairs, walking, running, or getting in and out of a chair.  Swelling.  Warmth.  Tenderness when pressing at the inside of the lower leg, just below the knee. How is this diagnosed? This condition may be diagnosed based on:  Your symptoms.  Your medical history.  A physical exam. ? During your physical exam, your health care provider will press on the tendon attachment to see if you feel pain. ? Your health care provider may also check your hip and knee motion and strength.  Tests to check for swelling and fluid buildup in the bursa and to look at muscles, bones, and tendons. These tests might include: ? X-rays. ? MRI. ? Ultrasound. How is this treated? This condition may be treated by:  Resting your knee. You may be told to raise (elevate) your knee while resting.  Avoiding activities that cause pain.  Icing the inside of your knee.  Sleeping with a pillow between your knees. This will cushion your  injured knee.  Taking medicine by mouth (orally) to reduce pain and swelling or having medicine injected into your knee.  Doing strengthening and stretching exercises (physical therapy). If these treatments do not work or if the condition keeps coming back, you may need to have surgery to remove the bursa. Follow these instructions at home: Managing pain, stiffness, and swelling   If directed, put ice on the injured area. ? Put ice in a plastic bag. ? Place a towel between your skin and the bag. ? Leave the ice on for 20 minutes, 2-3 times a day.  Elevate the injured area above the level of your heart while you are sitting or lying down. Activity  Return to your normal activities as told by your health care provider. Ask your health care provider what activities are safe for you.  Do exercises as told by your health care provider. General instructions  Take over-the-counter and prescription medicines only as told by your health care provider.  Sleep with a pillow between your knees.  Do not use any products that contain nicotine or tobacco, such as cigarettes, e-cigarettes, and chewing tobacco. These can delay healing. If you need help quitting, ask your health care provider.  If you are overweight, work with your health care provider and a dietitian to set a weight-loss goal that is healthy and reasonable for you.  Keep all follow-up visits as told by your health care provider. This is important. How is this prevented?    When exercising, make sure that you: ? Warm up and stretch before being active. ? Cool down and stretch after being active. ? Give your body time to rest between periods of activity. ? Use equipment that fits you. ? Are safe and responsible while being active to avoid falls. ? Do at least 150 minutes of moderate-intensity exercise each week, such as brisk walking or water aerobics. ? Maintain physical fitness,  including:  Strength.  Flexibility.  Cardiovascular fitness.  Endurance. ? Maintain a healthy weight. Contact a health care provider if:  Your symptoms do not improve.  Your symptoms get worse. Summary  Pes anserine bursitis is a condition that happens when the fluid-filled sac (bursa) at the inside of your knee gets swollen and irritated. The condition causes knee pain.  Treatment for pes anserine bursitis may include resting your knee, icing the inside of your knee, sleeping with a pillow between your knees, taking medicine by mouth or by injection, and doing strengthening and stretching exercises (physical therapy).  Follow instructions for managing pain, stiffness, and swelling.  Take over-the-counter and prescription medicines only as told by your health care provider. This information is not intended to replace advice given to you by your health care provider. Make sure you discuss any questions you have with your health care provider. Document Revised: 08/07/2018 Document Reviewed: 09/25/2017 Elsevier Patient Education  2020 [employer]vier Inc.  
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
## 2019-09-01 ENCOUNTER — Other Ambulatory Visit: Payer: Self-pay

## 2019-09-01 ENCOUNTER — Encounter: Payer: Self-pay | Admitting: Family Medicine

## 2019-09-01 ENCOUNTER — Ambulatory Visit: Payer: BC Managed Care – PPO | Admitting: Family Medicine

## 2019-09-01 VITALS — BP 132/74 | HR 71 | Temp 97.6°F | Resp 16 | Ht 70.0 in | Wt 205.6 lb

## 2019-09-01 DIAGNOSIS — K219 Gastro-esophageal reflux disease without esophagitis: Secondary | ICD-10-CM | POA: Diagnosis not present

## 2019-09-01 DIAGNOSIS — R5382 Chronic fatigue, unspecified: Secondary | ICD-10-CM

## 2019-09-01 DIAGNOSIS — E7800 Pure hypercholesterolemia, unspecified: Secondary | ICD-10-CM | POA: Diagnosis not present

## 2019-09-01 DIAGNOSIS — Z8719 Personal history of other diseases of the digestive system: Secondary | ICD-10-CM

## 2019-09-01 DIAGNOSIS — I10 Essential (primary) hypertension: Secondary | ICD-10-CM | POA: Diagnosis not present

## 2019-09-01 LAB — BASIC METABOLIC PANEL
BUN: 16 mg/dL (ref 6–23)
CO2: 27 mEq/L (ref 19–32)
Calcium: 10 mg/dL (ref 8.4–10.5)
Chloride: 103 mEq/L (ref 96–112)
Creatinine, Ser: 0.81 mg/dL (ref 0.40–1.50)
GFR: 98.62 mL/min (ref >60.00)
Glucose, Bld: 139 mg/dL — ABNORMAL HIGH (ref 70–99)
Potassium: 4.2 mEq/L (ref 3.5–5.1)
Sodium: 137 mEq/L (ref 135–145)
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
## 2019-09-02 ENCOUNTER — Other Ambulatory Visit: Payer: Self-pay | Admitting: Family Medicine

## 2019-09-02 DIAGNOSIS — R6882 Decreased libido: Secondary | ICD-10-CM

## 2019-09-02 DIAGNOSIS — R7989 Other specified abnormal findings of blood chemistry: Secondary | ICD-10-CM

## 2019-09-02 DIAGNOSIS — R5382 Chronic fatigue, unspecified: Secondary | ICD-10-CM

## 2019-09-02 LAB — TESTOSTERONE TOTAL,FREE,BIO, MALES
Albumin: 4.5 g/dL (ref 3.6–5.1)
Sex Hormone Binding: 43 nmol/L (ref 10–50)
Testosterone, Bioavailable: 70.3 ng/dL — ABNORMAL LOW (ref >=110.0)
Testosterone, Free: 34.2 pg/mL — ABNORMAL LOW (ref 46.0–224.0)
Testosterone: 333 ng/dL (ref 250–827)

## 2019-09-07 ENCOUNTER — Other Ambulatory Visit: Payer: Self-pay

## 2019-09-07 ENCOUNTER — Ambulatory Visit (INDEPENDENT_AMBULATORY_CARE_PROVIDER_SITE_OTHER): Payer: BC Managed Care – PPO | Admitting: Family Medicine

## 2019-09-07 DIAGNOSIS — E291 Testicular hypofunction: Secondary | ICD-10-CM | POA: Diagnosis not present

## 2019-09-07 DIAGNOSIS — R5382 Chronic fatigue, unspecified: Secondary | ICD-10-CM

## 2019-09-07 DIAGNOSIS — R7989 Other specified abnormal findings of blood chemistry: Secondary | ICD-10-CM | POA: Diagnosis not present

## 2019-09-07 DIAGNOSIS — R6882 Decreased libido: Secondary | ICD-10-CM | POA: Diagnosis not present

## 2019-09-07 LAB — TSH: TSH: 1.85 u[IU]/mL (ref 0.35–4.50)

## 2019-09-07 LAB — LUTEINIZING HORMONE: LH: 3.51 m[IU]/mL (ref 1.50–9.30)

## 2019-09-07 LAB — CORTISOL: Cortisol, Plasma: 13.7 ug/dL

## 2019-09-07 LAB — FOLLICLE STIMULATING HORMONE: FSH: 7.5 m[IU]/mL (ref 1.4–18.1)

## 2019-09-13 LAB — ESTROGENS, TOTAL: Estrogen: 139.7 pg/mL (ref 60–190)

## 2019-09-13 LAB — TESTOSTERONE TOTAL,FREE,BIO, MALES
Albumin: 4.4 g/dL (ref 3.6–5.1)
Sex Hormone Binding: 38 nmol/L (ref 10–50)
Testosterone, Bioavailable: 94.9 ng/dL — ABNORMAL LOW (ref >=110.0)
Testosterone, Free: 47.2 pg/mL (ref 46.0–224.0)
Testosterone: 403 ng/dL (ref 250–827)

## 2019-09-13 LAB — PROLACTIN: Prolactin: 5.2 ng/mL (ref 2.0–18.0)

## 2019-11-11 ENCOUNTER — Other Ambulatory Visit: Payer: Self-pay | Admitting: Family Medicine

## 2020-02-07 ENCOUNTER — Other Ambulatory Visit: Payer: Self-pay | Admitting: Family Medicine

## 2020-03-07 ENCOUNTER — Ambulatory Visit (INDEPENDENT_AMBULATORY_CARE_PROVIDER_SITE_OTHER): Payer: BC Managed Care – PPO | Admitting: Family Medicine

## 2020-03-07 ENCOUNTER — Other Ambulatory Visit: Payer: Self-pay

## 2020-03-07 ENCOUNTER — Encounter: Payer: Self-pay | Admitting: Family Medicine

## 2020-03-07 ENCOUNTER — Telehealth: Payer: Self-pay | Admitting: Family Medicine

## 2020-03-07 VITALS — BP 138/90 | HR 57 | Temp 97.8°F | Wt 211.0 lb

## 2020-03-07 DIAGNOSIS — Z1211 Encounter for screening for malignant neoplasm of colon: Secondary | ICD-10-CM | POA: Diagnosis not present

## 2020-03-07 DIAGNOSIS — I10 Essential (primary) hypertension: Secondary | ICD-10-CM | POA: Diagnosis not present

## 2020-03-07 DIAGNOSIS — R5382 Chronic fatigue, unspecified: Secondary | ICD-10-CM

## 2020-03-07 DIAGNOSIS — Z8546 Personal history of malignant neoplasm of prostate: Secondary | ICD-10-CM | POA: Diagnosis not present

## 2020-03-07 DIAGNOSIS — R972 Elevated prostate specific antigen [PSA]: Secondary | ICD-10-CM

## 2020-03-07 DIAGNOSIS — Z23 Encounter for immunization: Secondary | ICD-10-CM

## 2020-03-07 DIAGNOSIS — E7800 Pure hypercholesterolemia, unspecified: Secondary | ICD-10-CM

## 2020-03-07 DIAGNOSIS — Z0000 Encounter for general adult medical examination without abnormal findings: Secondary | ICD-10-CM | POA: Diagnosis not present

## 2020-03-07 DIAGNOSIS — R7989 Other specified abnormal findings of blood chemistry: Secondary | ICD-10-CM

## 2020-03-07 LAB — CBC WITH DIFFERENTIAL/PLATELET
Basophils Absolute: 0.1 10*3/uL (ref 0.0–0.1)
Basophils Relative: 0.9 % (ref 0.0–3.0)
Eosinophils Absolute: 0.1 10*3/uL (ref 0.0–0.7)
Eosinophils Relative: 1.2 % (ref 0.0–5.0)
HCT: 45.8 % (ref 39.0–52.0)
Hemoglobin: 15.6 g/dL (ref 13.0–17.0)
Lymphocytes Relative: 29.3 % (ref 12.0–46.0)
Lymphs Abs: 2 10*3/uL (ref 0.7–4.0)
MCHC: 34 g/dL (ref 30.0–36.0)
MCV: 91 fl (ref 78.0–100.0)
Monocytes Absolute: 0.8 10*3/uL (ref 0.1–1.0)
Monocytes Relative: 11.7 % (ref 3.0–12.0)
Neutro Abs: 3.9 10*3/uL (ref 1.4–7.7)
Neutrophils Relative %: 56.9 % (ref 43.0–77.0)
Platelets: 240 10*3/uL (ref 150.0–400.0)
RBC: 5.04 Mil/uL (ref 4.22–5.81)
RDW: 13.2 % (ref 11.5–15.5)
WBC: 6.8 10*3/uL (ref 4.0–10.5)

## 2020-03-07 LAB — COMPREHENSIVE METABOLIC PANEL
ALT: 30 U/L (ref 0–53)
AST: 24 U/L (ref 0–37)
Albumin: 4.6 g/dL (ref 3.5–5.2)
Alkaline Phosphatase: 53 U/L (ref 39–117)
BUN: 15 mg/dL (ref 6–23)
CO2: 25 mEq/L (ref 19–32)
Calcium: 9.4 mg/dL (ref 8.4–10.5)
Chloride: 104 mEq/L (ref 96–112)
Creatinine, Ser: 0.88 mg/dL (ref 0.40–1.50)
GFR: 96.2 mL/min (ref >60.00)
Glucose, Bld: 104 mg/dL — ABNORMAL HIGH (ref 70–99)
Potassium: 4.6 mEq/L (ref 3.5–5.1)
Sodium: 136 mEq/L (ref 135–145)
Total Bilirubin: 0.8 mg/dL (ref 0.2–1.2)
Total Protein: 6.7 g/dL (ref 6.0–8.3)

## 2020-03-07 LAB — LIPID PANEL
Cholesterol: 156 mg/dL (ref 0–200)
HDL: 45.5 mg/dL (ref >39.00)
LDL Cholesterol: 93 mg/dL (ref 0–99)
NonHDL: 110.43
Total CHOL/HDL Ratio: 3
Triglycerides: 87 mg/dL (ref 0.0–149.0)
VLDL: 17.4 mg/dL (ref 0.0–40.0)

## 2020-03-07 NOTE — Telephone Encounter (Signed)
Spoke with patient and advised unable to locate fax number. I looked thru care everywhere while patient was still on the phone and added [facility] phone and fax number to specialty comments on snapshot. Patient was aware I would fax results once PCP annotated on.

## 2020-03-07 NOTE — Telephone Encounter (Signed)
Pt called in after he left today and asked that his lab results be sent to [facility]. Says you have the info you need to send them as it has been done for him in the past.

## 2020-03-07 NOTE — Patient Instructions (Signed)
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
## 2020-03-08 ENCOUNTER — Other Ambulatory Visit (INDEPENDENT_AMBULATORY_CARE_PROVIDER_SITE_OTHER): Payer: BC Managed Care – PPO

## 2020-03-08 ENCOUNTER — Other Ambulatory Visit: Payer: Self-pay | Admitting: Family Medicine

## 2020-03-08 DIAGNOSIS — R7301 Impaired fasting glucose: Secondary | ICD-10-CM

## 2020-03-08 LAB — HEMOGLOBIN A1C: Hgb A1c MFr Bld: 5.7 % (ref 4.6–6.5)

## 2020-03-09 ENCOUNTER — Telehealth: Payer: Self-pay | Admitting: Family Medicine

## 2020-03-09 ENCOUNTER — Encounter: Payer: Self-pay | Admitting: Family Medicine

## 2020-03-09 NOTE — Telephone Encounter (Signed)
I don't check his PSA level b/c he gets this appropriately followed by his urologist.

## 2020-03-09 NOTE — Telephone Encounter (Signed)
Pt advised regarding lab results. He did inquire about why PSA level was not checked with recent labs from 11/8. I told him I was not sure but would send PCP a message to follow up on this.   Please advise, thanks.

## 2020-03-09 NOTE — Telephone Encounter (Signed)
Noted. I called pt. See my telephone note to office managers summarizing my conversation with the patient---dismissal of pt. Signed:  Crissie Sickles, MD           03/09/2020

## 2020-03-09 NOTE — Telephone Encounter (Signed)
Pls dismiss patient immediately.  I will not see him ever again.  I called pt b/c he was getting upset with my CMA about me not checking his PSA level. He has hx of prostate ca and has been followed by urologist, therefore I've not been following his PSAs.  I told him this and he got upset b/c he now tells me that his urologist dismissed him from routine f/u and was expecting me to be following PSA level. Pt did not ever tell me this, nor did his urologist communicate with me about this. I did offer to have him come back for lab visit to check PSA level (if we could not add it on to his labs from recent o/v).   I staunchly defended the appropriateness of my care and he went on to say he does not like me, said I was full of s..." Said that I can "kiss his a.. "  I told him to find a new physician.  Thank you.

## 2020-03-09 NOTE — Telephone Encounter (Signed)
Labs all normal except fasting sugar just a tiny bit elevated. Hba1c 5.7%, which is just barely up to the level to be considered "prediabetes". This does not necessarily mean that she is going to develop "full blown" diabetes, but she is at higher risk for diabetes. Lifestyle changes can often halt the process. I recommend she minimize sugars (esp any colas that are not diet, and also sweet tea), limit dessert foods, snack foods, fast foods, and restaurant foods.  Minimize potatoes, white bread, rice, and pasta.  Increase fresh fruits and fresh vegetables. Drink more water. I also recommend cardiovascular exercise: start slow and work your way up to a goal of doing vigorous exercise such as running, biking, swimming, stair stepper, etc-->5-10 min 1-2 days per week at first.  Gradually increase intensity of exercise and build up to 30 min a day 5-7 days a week.

## 2020-03-09 NOTE — Telephone Encounter (Signed)
Spoke with patient and he was advised of the fact that his urologist normally follows him for PSA level. He stated "this was ridiculous and he has always had it checked in our office and sent to [facility] ". Advised no recent PSA level completed within the last year in our office and unsure if lab could be added due to time frame of lab draw. He asked who was in charge of the office here, name of office manager given. He would like to file complaint is what he stated.

## 2020-03-16 IMAGING — CT CT OF THE RIGHT FOOT WITHOUT CONTRAST
3 of 5 series · 12 of 33 positions shown, 14 images · non-contrast
Comparison: MRI 12/03/2018

CLINICAL DATA: Third proximal phalanx fracture.

EXAM:
CT OF THE RIGHT FOOT WITHOUT CONTRAST
TECHNIQUE: Multidetector CT imaging of the right foot was performed according
to the standard protocol. Multiplanar CT image reconstructions were
also generated.

[Series 10: sagsoft tissue · sagittal · 0.36mm/px · 5 of 53 slices shown, 6 images]
[im 18/53  bone]
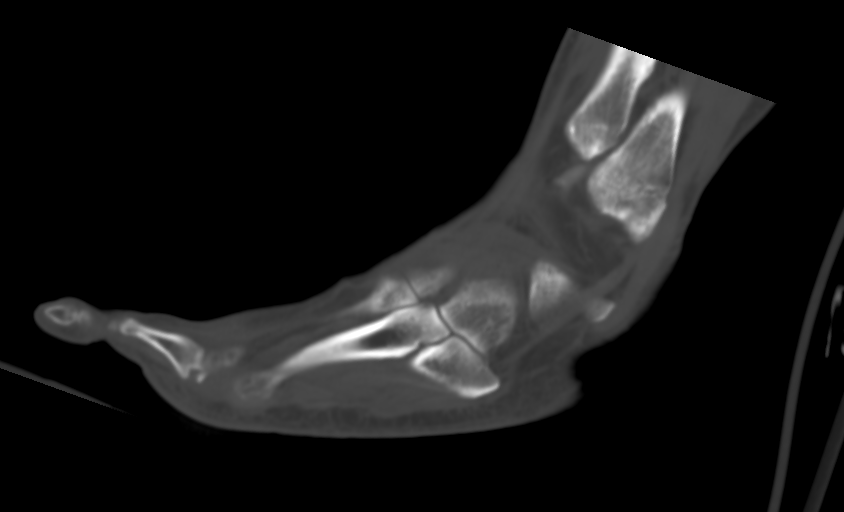
[im 22/53  bone]
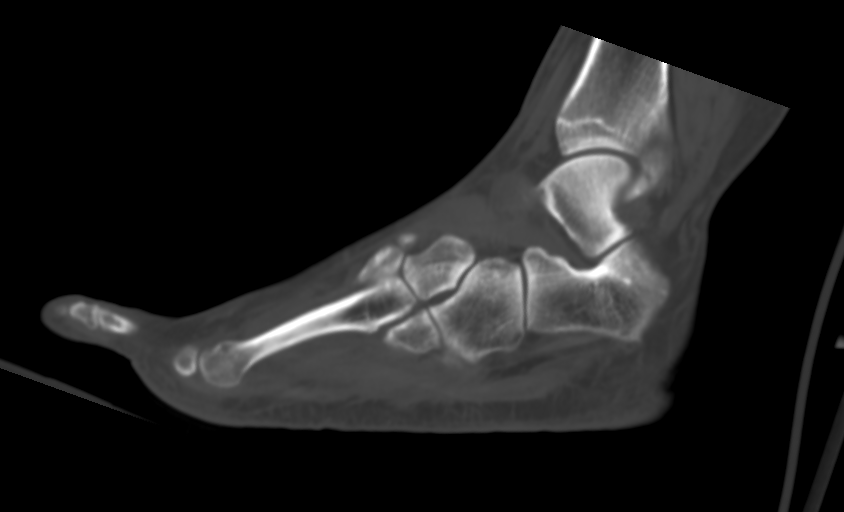
[im 27/53  soft-tissue]
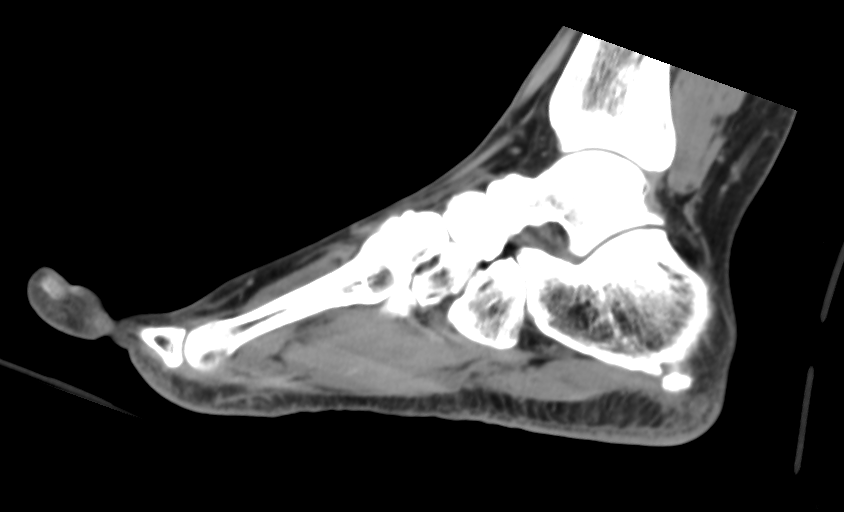
[im 27/53  bone]
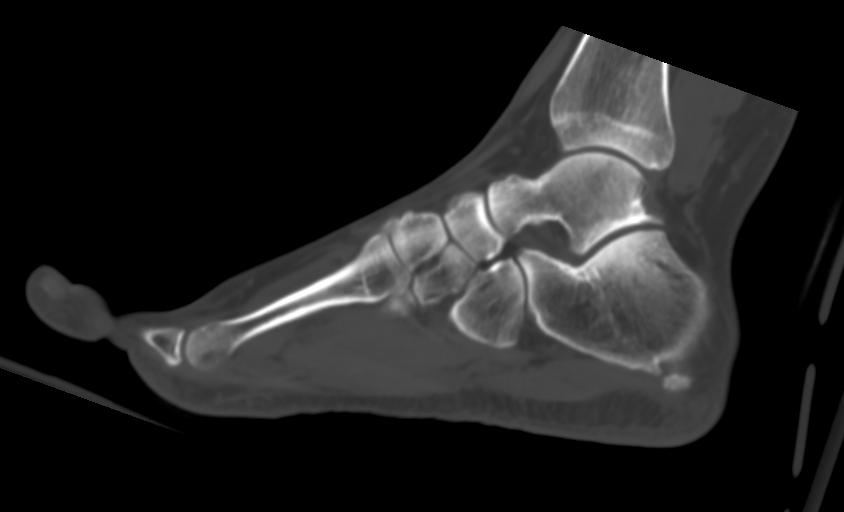
[im 31/53  bone]
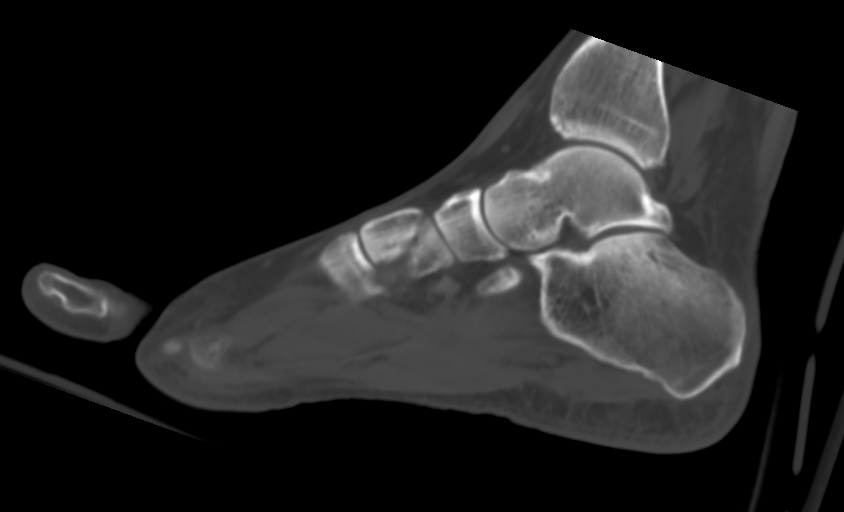
[im 35/53  bone]
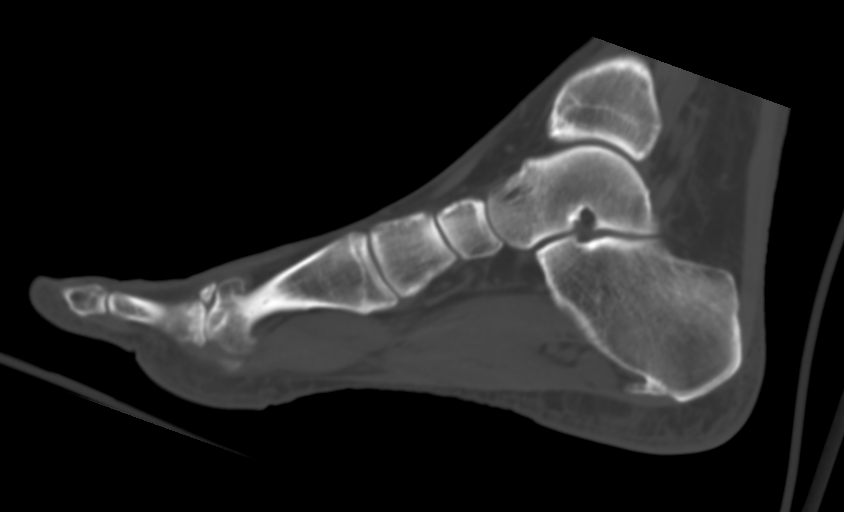

[Series 602: cor · axial · 0.53mm/px · z∈[-293,-217]mm · 4 of 74 slices shown, 5 images]
[im 15/74  soft-tissue]
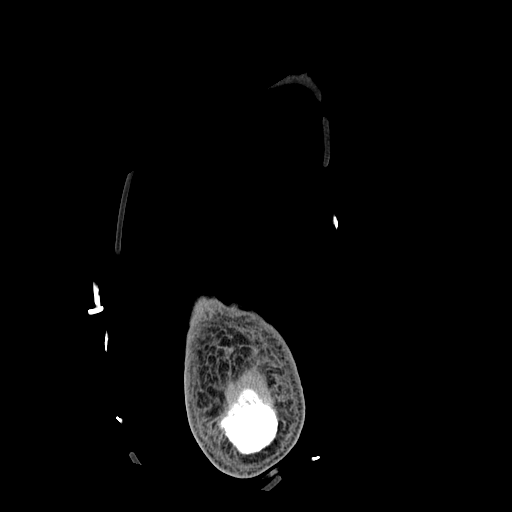
[im 15/74  bone]
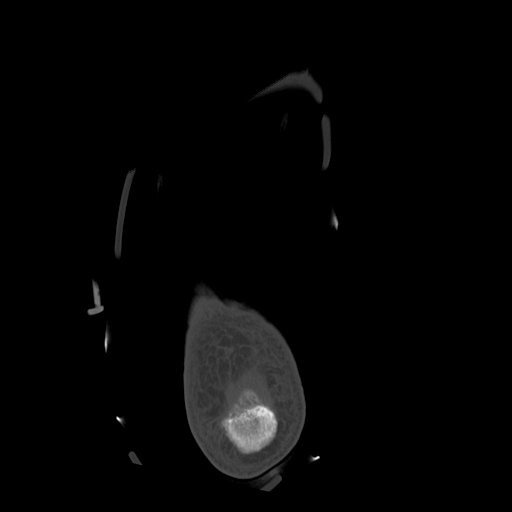
[im 30/74  bone]
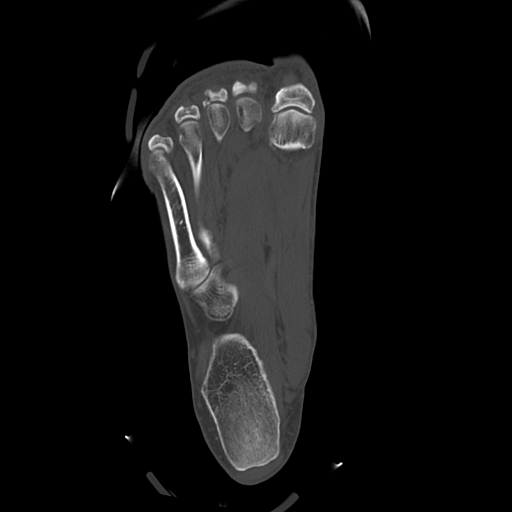
[im 44/74  bone]
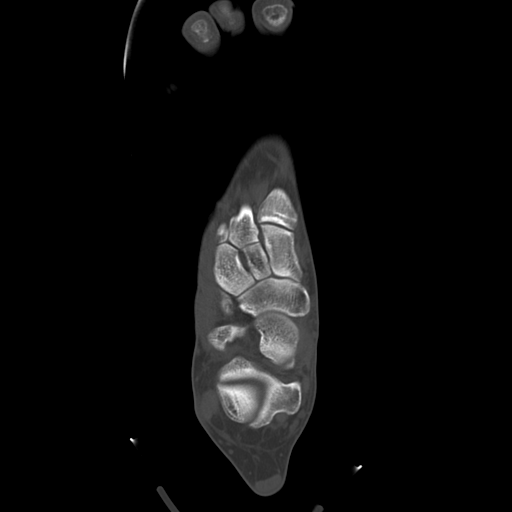
[im 59/74  bone]
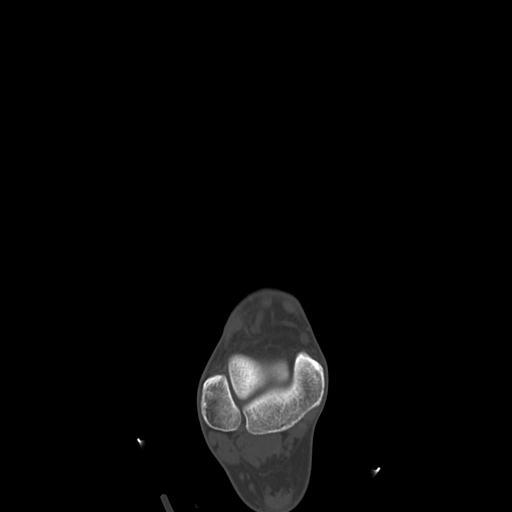

[Series 604: hindfoot axial · coronal · 0.53mm/px · 3 of 135 slices shown]
[im 29/135  bone]
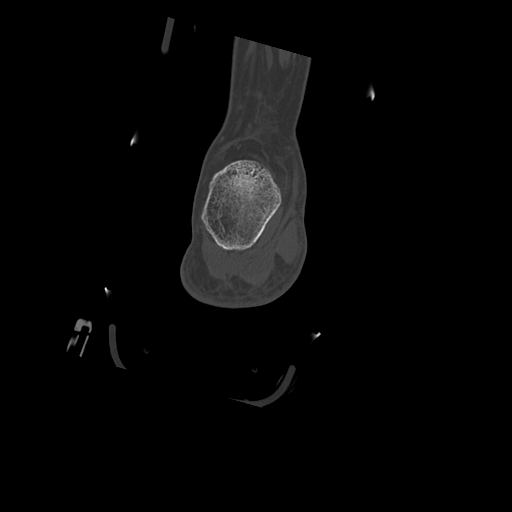
[im 57/135  bone]
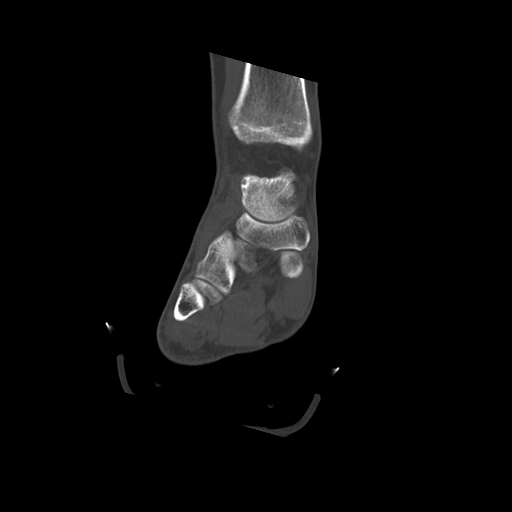
[im 86/135  bone]
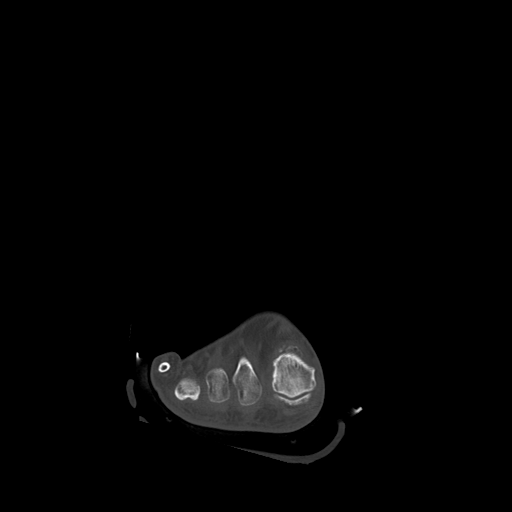

[12 of 33 positions shown; findings below may reference images not displayed]

FINDINGS: Bones/Joint/Cartilage

Known fracture at the base of the third digit proximal phalanx
involving the plantar-lateral cortex with mild displacement of up to
3 mm (series 3, image 82; series 9, images 19-20). No identifiable
bridging callus formation.

No additional fractures are identified. No malalignment.
Mild-to-moderate degenerative changes at the first MTP joint with
dorsal hypertrophy. Prominent plantar calcaneal spur. Ankle mortise
is congruent with preservation of the tibiotalar joint space.

Ligaments

Suboptimally assessed by CT.

Muscles and Tendons

Preserved muscle bulk without atrophy or fatty infiltration. Grossly
intact tendons of the foot and ankle.

Soft tissues

No soft tissue fluid collections or hematoma.
IMPRESSION: Minimally displaced fracture involving the plantar-lateral cortex of
the third digit proximal phalanx base without bridging callus
formation concerning for delayed union.

## 2020-07-15 DIAGNOSIS — M9902 Segmental and somatic dysfunction of thoracic region: Secondary | ICD-10-CM | POA: Diagnosis not present

## 2020-07-15 DIAGNOSIS — M7912 Myalgia of auxiliary muscles, head and neck: Secondary | ICD-10-CM | POA: Diagnosis not present

## 2020-07-15 DIAGNOSIS — M47812 Spondylosis without myelopathy or radiculopathy, cervical region: Secondary | ICD-10-CM | POA: Diagnosis not present

## 2020-07-15 DIAGNOSIS — M9901 Segmental and somatic dysfunction of cervical region: Secondary | ICD-10-CM | POA: Diagnosis not present

## 2020-07-22 DIAGNOSIS — M9902 Segmental and somatic dysfunction of thoracic region: Secondary | ICD-10-CM | POA: Diagnosis not present

## 2020-07-22 DIAGNOSIS — M7912 Myalgia of auxiliary muscles, head and neck: Secondary | ICD-10-CM | POA: Diagnosis not present

## 2020-07-22 DIAGNOSIS — M9901 Segmental and somatic dysfunction of cervical region: Secondary | ICD-10-CM | POA: Diagnosis not present

## 2020-07-22 DIAGNOSIS — M47812 Spondylosis without myelopathy or radiculopathy, cervical region: Secondary | ICD-10-CM | POA: Diagnosis not present

## 2020-09-04 ENCOUNTER — Other Ambulatory Visit: Payer: Self-pay | Admitting: Family Medicine

## 2020-09-05 ENCOUNTER — Ambulatory Visit: Payer: BC Managed Care – PPO | Admitting: Family Medicine

## 2020-10-14 DIAGNOSIS — I10 Essential (primary) hypertension: Secondary | ICD-10-CM | POA: Diagnosis not present

## 2020-10-14 DIAGNOSIS — M7052 Other bursitis of knee, left knee: Secondary | ICD-10-CM | POA: Diagnosis not present

## 2020-10-14 DIAGNOSIS — E782 Mixed hyperlipidemia: Secondary | ICD-10-CM | POA: Diagnosis not present

## 2020-10-14 DIAGNOSIS — C61 Malignant neoplasm of prostate: Secondary | ICD-10-CM | POA: Diagnosis not present

## 2020-10-14 DIAGNOSIS — M1712 Unilateral primary osteoarthritis, left knee: Secondary | ICD-10-CM | POA: Diagnosis not present

## 2021-02-27 DIAGNOSIS — I10 Essential (primary) hypertension: Secondary | ICD-10-CM | POA: Diagnosis not present

## 2021-02-27 DIAGNOSIS — Z0000 Encounter for general adult medical examination without abnormal findings: Secondary | ICD-10-CM | POA: Diagnosis not present

## 2021-02-27 DIAGNOSIS — H903 Sensorineural hearing loss, bilateral: Secondary | ICD-10-CM | POA: Diagnosis not present

## 2021-02-27 DIAGNOSIS — R5382 Chronic fatigue, unspecified: Secondary | ICD-10-CM | POA: Diagnosis not present

## 2021-02-27 DIAGNOSIS — C61 Malignant neoplasm of prostate: Secondary | ICD-10-CM | POA: Diagnosis not present

## 2021-02-27 DIAGNOSIS — E782 Mixed hyperlipidemia: Secondary | ICD-10-CM | POA: Diagnosis not present

## 2021-02-27 DIAGNOSIS — N5203 Combined arterial insufficiency and corporo-venous occlusive erectile dysfunction: Secondary | ICD-10-CM | POA: Diagnosis not present

## 2021-02-27 DIAGNOSIS — Z131 Encounter for screening for diabetes mellitus: Secondary | ICD-10-CM | POA: Diagnosis not present

## 2021-03-28 DIAGNOSIS — M47812 Spondylosis without myelopathy or radiculopathy, cervical region: Secondary | ICD-10-CM | POA: Diagnosis not present

## 2021-03-28 DIAGNOSIS — M9901 Segmental and somatic dysfunction of cervical region: Secondary | ICD-10-CM | POA: Diagnosis not present

## 2021-03-28 DIAGNOSIS — M47816 Spondylosis without myelopathy or radiculopathy, lumbar region: Secondary | ICD-10-CM | POA: Diagnosis not present

## 2021-03-28 DIAGNOSIS — M9903 Segmental and somatic dysfunction of lumbar region: Secondary | ICD-10-CM | POA: Diagnosis not present
